# Patient Record
Sex: Female | Born: 1945
Health system: Southern US, Community
[De-identification: ages and names within clinical notes are randomized; demographics above are authoritative.]

## PROBLEM LIST (undated history)

## (undated) DIAGNOSIS — Z9889 Other specified postprocedural states: Secondary | ICD-10-CM

## (undated) DIAGNOSIS — E119 Type 2 diabetes mellitus without complications: Secondary | ICD-10-CM

## (undated) HISTORY — PX: WRIST SURGERY: SHX841

## (undated) HISTORY — PX: ANKLE SURGERY: SHX546

## (undated) HISTORY — PX: EYE SURGERY: SHX253

---

## 2006-10-15 ENCOUNTER — Encounter: Admission: RE | Admit: 2006-10-15 | Discharge: 2006-10-15 | Payer: Self-pay | Admitting: Occupational Medicine

## 2008-04-29 ENCOUNTER — Ambulatory Visit: Payer: Self-pay | Admitting: Interventional Radiology

## 2008-04-29 ENCOUNTER — Emergency Department (HOSPITAL_BASED_OUTPATIENT_CLINIC_OR_DEPARTMENT_OTHER): Admission: EM | Admit: 2008-04-29 | Discharge: 2008-04-29 | Payer: Self-pay | Admitting: Emergency Medicine

## 2008-05-03 ENCOUNTER — Ambulatory Visit (HOSPITAL_BASED_OUTPATIENT_CLINIC_OR_DEPARTMENT_OTHER): Admission: RE | Admit: 2008-05-03 | Discharge: 2008-05-04 | Payer: Self-pay | Admitting: Orthopedic Surgery

## 2010-04-30 LAB — BASIC METABOLIC PANEL
CO2: 29 mEq/L (ref 19–32)
Calcium: 9.4 mg/dL (ref 8.4–10.5)
Chloride: 100 mEq/L (ref 96–112)
GFR calc Af Amer: >60 mL/min (ref >60)
Glucose, Bld: 260 mg/dL — ABNORMAL HIGH (ref 70–99)
Potassium: 4.1 mEq/L (ref 3.5–5.1)
Sodium: 140 mEq/L (ref 135–145)

## 2010-04-30 LAB — DIFFERENTIAL
Basophils Absolute: 0.1 10*3/uL (ref 0.0–0.1)
Basophils Relative: 1 % (ref 0–1)
Eosinophils Relative: 2 % (ref 0–5)
Monocytes Absolute: 0.4 10*3/uL (ref 0.1–1.0)
Monocytes Relative: 5 % (ref 3–12)
Neutro Abs: 4.4 10*3/uL (ref 1.7–7.7)

## 2010-04-30 LAB — GLUCOSE, CAPILLARY: Glucose-Capillary: 223 mg/dL — ABNORMAL HIGH (ref 70–99)

## 2010-04-30 LAB — CBC
HCT: 41.3 % (ref 36.0–46.0)
Hemoglobin: 13.3 g/dL (ref 12.0–15.0)
MCHC: 32.3 g/dL (ref 30.0–36.0)
MCV: 80.4 fL (ref 78.0–100.0)
RBC: 5.14 MIL/uL — ABNORMAL HIGH (ref 3.87–5.11)
RDW: 14.4 % (ref 11.5–15.5)

## 2010-06-03 NOTE — Op Note (Signed)
Bethany Ortiz            ACCOUNT NO.:  [id number]   MEDICAL RECORD NO.:  [id number]          PATIENT TYPE:  AMB   LOCATION:  DSC                          FACILITY:  [facility]   PHYSICIAN:  Loreta Ave, M.D. DATE OF BIRTH:  Oct 28, 1945   DATE OF PROCEDURE:  05/03/2008  DATE OF DISCHARGE:                               OPERATIVE REPORT   PREOPERATIVE DIAGNOSIS:  Displaced trimalleolar ankle fracture, right  ankle.   POSTOPERATIVE DIAGNOSIS:  Displaced trimalleolar ankle fracture, right  ankle, with underlying osteopenia and marked comminution of the medial  malleolus fracture.   PROCEDURE:  Open reduction and internal fixation of trimalleolar  fracture with two 4.0 cannulated screws medial, one cannulated 4.0 screw  for the posterior fragment, a six-hole Synthes locking plate and screws  laterally.   SURGEON:  Loreta Ave, MD   ASSISTANT:  Bethany Ortiz. Barry Dienes, PA present throughout the entire case  necessary for timely completion of procedure.   ANESTHESIA:  General   BLOOD LOSS:  Minimal.   TOURNIQUET TIME:  1 hour.   SPECIMENS:  None.   CULTURES:  None.   COMPLICATIONS:  None.   DRESSINGS:  Soft compressive with short leg splint.   PROCEDURE IN DETAIL:  The patient was brought to the operating room,  placed on the operating table in supine position.  After adequate  anesthesia was obtained, ankle examined with fluoroscopic views.  The  medial side looked like one small but intact piece on fluoroscopy.  The  posterior fragment about 25% joint surface displaced superiorly.  Spiral  fracture of the fibula.  Syndesmosis still intact.  Tourniquet applied.  Prepped and draped in usual sterile fashion.  Exsanguinated with  elevation Esmarch.  Tourniquet inflated to 350 mmHg.  Attention turned  medially.  Longitudinal incision on the medial malleolus heading down  distal and a little posterior.  Skin and subcutaneous tissue divided.  Fracture exposed.  More  comminution appreciated on x-ray.  I was  eventually able to get this anatomically reduced and then fixed with  guide wires and two 4.0 screws.  This held the fragment in place,  restored the joint surface.  Comminution at the outer side was then  trapped with a figure-of-eight Vicryl.  It was brought through a hole in  the tibia to lock the outer fragments in place.  Although comminuted and  not grade bone quality, I was very pleased with alignment and fixation.  Attention turned laterally.  Longitudinal incision over the fibula.  Subperiosteal exposure fracture.  Fracture cleaned out.  Reduced  anatomically.  Fixed with a 6-hole plate placed posterolaterally.  Care  taken not to enter the joint screws.  Good compression alignment and  fixation.  Mortis intact but the posterior fragment was still pushed up  and back a little bit.  With blunt dissection, I went behind the tibia  from lateral side I was able to bring the fragment down and hold in  place.  From the medial side anteriorly, a guidewire was passed from the  tibia anteromedially across the posterior fragment into that back  fragment.  Holding and locked in place, it was free drilled and then  fixed with a 4.0 lag screw.  This brought the fragment with good  compression against the back of the tibia and also restored nice  congruent joint surface.  At completion, all fractures and mortise  aligned anatomically.  This was confirmed looking at the ankle  throughout.  Wounds were all irrigated.  Closed with Vicryl and then  staples.  Sterile compressive dressing applied.  Short leg splint  applied.  Tourniquet inflated and removed.  Anesthesia reversed.  Brought to the recovery room.  Tolerated surgery well.  No  complications.      Loreta Ave, M.D.  Electronically Signed     DFM/MEDQ  D:  05/03/2008  T:  05/04/2008  Job:  [id number]

## 2010-11-11 ENCOUNTER — Encounter: Payer: Self-pay | Admitting: Family Medicine

## 2010-11-11 ENCOUNTER — Inpatient Hospital Stay (INDEPENDENT_AMBULATORY_CARE_PROVIDER_SITE_OTHER)
Admission: RE | Admit: 2010-11-11 | Discharge: 2010-11-11 | Disposition: A | Discharge status: Home / Self Care | Payer: Self-pay | Source: Ambulatory Visit | Attending: Family Medicine | Admitting: Family Medicine

## 2010-11-11 ENCOUNTER — Ambulatory Visit
Admission: RE | Admit: 2010-11-11 | Discharge: 2010-11-11 | Disposition: A | Discharge status: Home / Self Care | Payer: Self-pay | Source: Ambulatory Visit | Attending: Family Medicine | Admitting: Family Medicine

## 2010-11-11 ENCOUNTER — Other Ambulatory Visit: Payer: Self-pay | Admitting: Family Medicine

## 2010-11-11 DIAGNOSIS — IMO0002 Reserved for concepts with insufficient information to code with codable children: Secondary | ICD-10-CM

## 2010-11-11 DIAGNOSIS — E119 Type 2 diabetes mellitus without complications: Secondary | ICD-10-CM | POA: Insufficient documentation

## 2010-11-11 DIAGNOSIS — M79609 Pain in unspecified limb: Secondary | ICD-10-CM

## 2010-12-22 NOTE — Progress Notes (Signed)
Summary: Pain in left leg rm 3   Vital Signs:  Patient Profile:   65 Years Old Female CC:      LT leg pain x 4 days Height:     61 inches Weight:      124 pounds O2 Sat:      96 % O2 treatment:    Room Air Temp:     98.3 degrees F oral Pulse rate:   88 / minute Resp:     16 per minute BP sitting:   143 / 84  (left arm) Cuff size:   regular  Vitals Entered By: Clemens Catholic LPN (November 11, 2010 8:49 AM)                  Updated Prior Medication List: AMARYL 2 MG TABS (GLIMEPIRIDE)   Current Allergies: No known allergies History of Present Illness Chief Complaint: LT leg pain x 4 days History of Present Illness:  Subjective:  Patient complains of 4 day history of pain in left lateral lower leg when walking (she walks her dog daily).  She has been limping, and now has occasional ache in her left lower back.  She recalls no trauma to her leg.  No bowel or bladder dysfunction.  No saddle numbness.  No chest pain or shortness of breath   REVIEW OF SYSTEMS Constitutional Symptoms      Denies fever, chills, night sweats, weight loss, weight gain, and fatigue.  Eyes       Denies change in vision, eye pain, eye discharge, glasses, contact lenses, and eye surgery. Ear/Nose/Throat/Mouth       Denies hearing loss/aids, change in hearing, ear pain, ear discharge, dizziness, frequent runny nose, frequent nose bleeds, sinus problems, sore throat, hoarseness, and tooth pain or bleeding.  Respiratory       Denies dry cough, productive cough, wheezing, shortness of breath, asthma, bronchitis, and emphysema/COPD.  Cardiovascular       Denies murmurs, chest pain, and tires easily with exhertion.    Gastrointestinal       Denies stomach pain, nausea/vomiting, diarrhea, constipation, blood in bowel movements, and indigestion. Genitourniary       Denies painful urination, kidney stones, and loss of urinary control. Neurological       Denies paralysis, seizures, and  fainting/blackouts. Musculoskeletal       Denies muscle pain, joint pain, joint stiffness, decreased range of motion, redness, swelling, muscle weakness, and gout.  Skin       Denies bruising, unusual mles/lumps or sores, and hair/skin or nail changes.  Psych       Denies mood changes, temper/anger issues, anxiety/stress, speech problems, depression, and sleep problems. Other Comments: pt c/o LLE pain x 4 days. no  injury, hurts to walk.  she has taken IBF.   Past History:  Past Medical History: Diabetes mellitus, type II  Past Surgical History: ankle surgery  Family History: gmother- diabetes  Social History: Never Smoked Alcohol use-no Drug use-no Smoking Status:  never Drug Use:  no   Objective:  Appearance:  Patient appears healthy, stated age, and in no acute distress  Eyes:  Pupils are equal, round, and reactive to light and accomodation.  Extraocular movement is intact.  Conjunctivae are not inflamed.  Neck:  Supple.  No adenopathy is present.   Lungs:  Clear to auscultation.  Breath sounds are equal.  Heart:  Regular rate and rhythm without murmurs, rubs, or gallops.  Abdomen:  Nontender without masses or hepatosplenomegaly.  Bowel sounds are present.  No CVA or flank tenderness.   Back:   No areas of tenderness.  Straight leg raising test is negative.  Sitting knee extension test is negative.  Strength and sensation in the lower extremities is normal.  Patellar and achilles reflexes are normal.  Extremities:  No edema.  Pedal pulses are full and equal.  Left leg beneath knee has distinct tenderness over mid-fibula laterally.  No swelling or deformity.  No erythema or warmth.  No calf tenderness.  Negative Homan's test. X-ray left tib/fib:  Negative Assessment New Problems: SHIN SPLINTS (ICD-844.9) LEG PAIN, LEFT (ICD-729.5) DIABETES MELLITUS, TYPE II (ICD-250.00)   Plan New Medications/Changes: TRAMADOL HCL 50 MG TABS (TRAMADOL HCL) One or two tabs by mouth hs  as needed for pain  #15 x 1, 11/11/2010, Donna Christen MD  New Orders: T-DG Tibia/Fibula*L* [[id number]] New Patient Level I [99201] Planning Comments:   Begin applying ice pack several times daily.  Continue ibuprofen.  Analgesic for bedtime. Begin stretching exercises ([facility] information and instruction patient handout given).  Wear well-fitting athletic shoes with arch support. Followup with [facility] if not improved in two weeks.   The patient and/or caregiver has been counseled thoroughly with regard to medications prescribed including dosage, schedule, interactions, rationale for use, and possible side effects and they verbalize understanding.  Diagnoses and expected course of recovery discussed and will return if not improved as expected or if the condition worsens. Patient and/or caregiver verbalized understanding.  Prescriptions: TRAMADOL HCL 50 MG TABS (TRAMADOL HCL) One or two tabs by mouth hs as needed for pain  #15 x 1   Entered and Authorized by:   Donna Christen MD   Signed by:   Donna Christen MD on 11/11/2010   Method used:   Print then Give to Patient   RxID:   [id number]   Orders Added: 1)  T-DG Tibia/Fibula*L* [[id number]] 2)  New Patient Level I [62952]

## 2013-05-03 IMAGING — CR DG TIBIA/FIBULA 2V*L*
2 series · 2 of 2 positions shown · non-contrast
Comparison: None.

CLINICAL DATA: Pain, no trauma

LEFT TIBIA AND FIBULA - 2 VIEW

[view not recorded (1 of 2)]
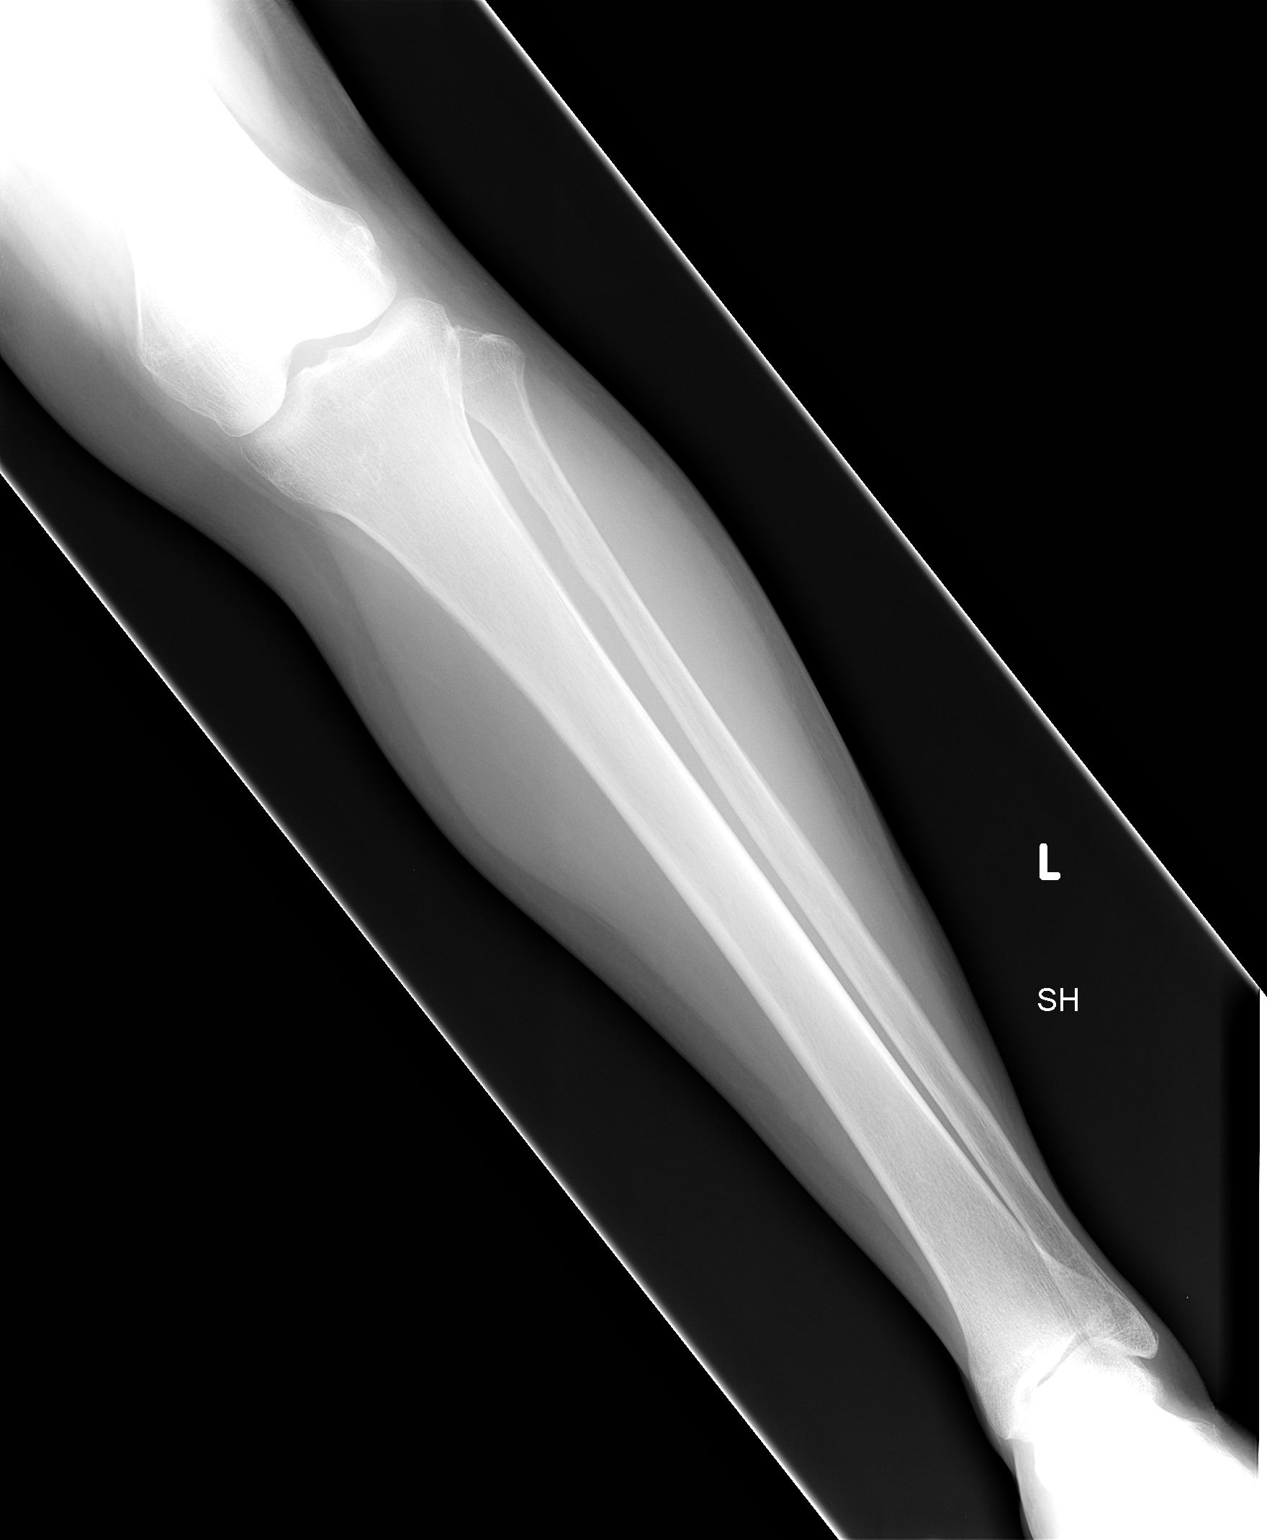

[view not recorded (2 of 2)]
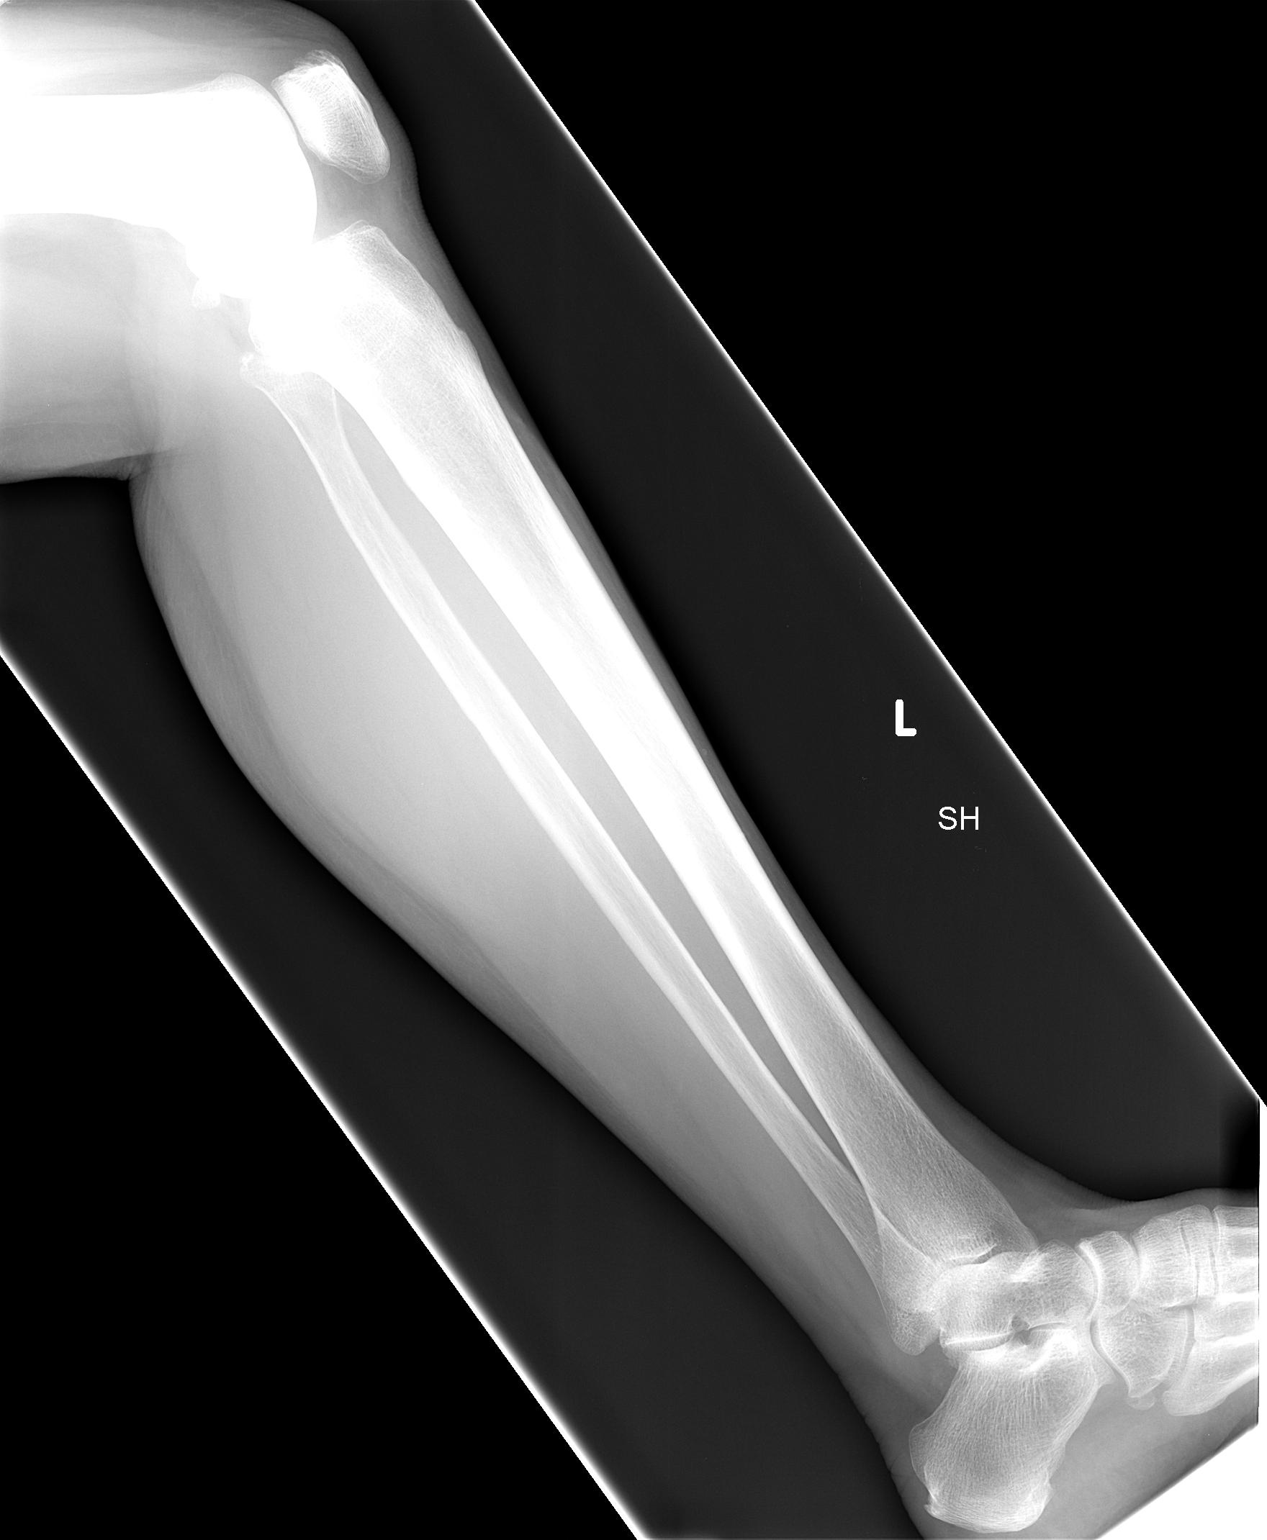

[2 of 2 positions shown; findings below may reference images not displayed]

FINDINGS: No osseous abnormality in the left tibia or fibula.  No
joint effusion.  No soft tissue abnormality.
IMPRESSION: No osseous abnormality.

## 2015-01-11 ENCOUNTER — Emergency Department
Admission: EM | Admit: 2015-01-11 | Discharge: 2015-01-11 | Disposition: A | Discharge status: Home / Self Care | Payer: Self-pay | Source: Home / Self Care | Attending: Family Medicine | Admitting: Family Medicine

## 2015-01-11 ENCOUNTER — Encounter: Payer: Self-pay | Admitting: Emergency Medicine

## 2015-01-11 DIAGNOSIS — L84 Corns and callosities: Secondary | ICD-10-CM

## 2015-01-11 HISTORY — DX: Type 2 diabetes mellitus without complications: E11.9

## 2015-01-11 HISTORY — DX: Other specified postprocedural states: Z98.890

## 2015-01-11 MED ORDER — MUPIROCIN 2 % EX OINT: 1.0000 "application " | TOPICAL_OINTMENT | Freq: Three times a day (TID) | CUTANEOUS | Status: DC

## 2015-01-11 NOTE — ED Provider Notes (Signed)
CSN: [id number]     Arrival date & time 01/11/15  1853 History   First MD Initiated Contact with Patient 01/11/15 1914     Chief Complaint  Patient presents with  . Toe Pain      HPI Comments: Patient has had a small tender lesion on her right 4th toe for about a month.  Recently the toe has become slightly red.  She began applying Neosporin ointment and the redness has almost resolved.  The history is provided by the patient.    Past Medical History  Diagnosis Date  . Diabetes mellitus without complication ([place])   . H/O eye surgery    Past Surgical History  Procedure Laterality Date  . Cesarean section      two  . Eye surgery    . Ankle surgery Right   . Wrist surgery Left    Family History  Problem Relation Age of Onset  . Cancer Father   . Diabetes Sister   . Diabetes Brother   . Hypertension Brother   . Cancer Brother   . Stroke Brother    Social History  Substance Use Topics  . Smoking status: Never Smoker   . Smokeless tobacco: None  . Alcohol Use: Yes   OB History    No data available     Review of Systems No fevers, chills, and sweats.  No pain or drainage from right 4th toe. Allergies  Review of patient's allergies indicates no known allergies.  Home Medications   Prior to Admission medications   Medication Sig Start Date End Date Taking? Authorizing Provider  glimepiride (AMARYL) 1 MG tablet Take 3 mg by mouth 2 (two) times daily.   Yes Historical Provider, MD  glipiZIDE (GLUCOTROL) 5 MG tablet Take 5 mg by mouth 2 (two) times daily before a meal.   Yes Historical Provider, MD  metFORMIN (GLUCOPHAGE) 1000 MG tablet Take 1,000 mg by mouth 2 (two) times daily with a meal.   Yes Historical Provider, MD  mupirocin ointment (BACTROBAN) 2 % Apply 1 application topically 3 (three) times daily. 01/11/15   Kandra Nicolas, MD   Meds Ordered and Administered this Visit  Medications - No data to display  BP 175/82 mmHg  Pulse 97  Temp(Src) 98.3 F (36.8  C) (Oral)  Resp 16  Ht 5\' 1"  (1.549 m)  Wt 112 lb (50.803 kg)  BMI 21.17 kg/m2  SpO2 99% No data found.   Physical Exam Patient appears comfortable and in no distress. Right foot:  No edema, swelling, erythema, or tenderness to palpation.  On the lateral edge of the 4th toe is a 49mm diameter hyperkeratotic callus which appears as if it had been previously traumatized and now healed.  Minimal surrounding erythema.  Toe has good range of motion  ED Course  Procedures none    Labs Reviewed  HEMOGLOBIN A1C     MDM   1. Skin callus; appears to have resolving cellulitis   Rx for mupirocin ointment. Change dressing daily and apply Mupirocin ointment to wound.  Use for 5 to 7 days until healed.  Separate toes with small piece of cotton.  Wear shoes that do not apply pressure to toes.    Patient has not had recent follow-up of her diabetes.  Will check Hgb A1c; recommend follow-up with local PCP for diabetic management.    Kandra Nicolas, MD 01/16/15 [phone]

## 2015-01-11 NOTE — Discharge Instructions (Signed)
Change dressing daily and apply Mupirocin ointment to wound.  Use for 5 to 7 days until healed.  Separate toes with small piece of cotton.  Wear shoes that do not apply pressure to toes.      Corns and Calluses Corns are small areas of thickened skin that occur on the top, sides, or tip of a toe. They contain a cone-shaped core with a point that can press on a nerve below. This causes pain. Calluses are areas of thickened skin that can occur anywhere on the body including hands, fingers, palms, soles of the feet, and heels.Calluses are usually larger than corns.  CAUSES  Corns and calluses are caused by rubbing (friction) or pressure, such as from shoes that are too tight or do not fit properly.  RISK FACTORS Corns are more likely to develop in people who have toe deformities, such as hammer toes. Since calluses can occur with friction to any area of the skin, calluses are more likely to develop in people who:   Work with their hands.  Wear shoes that fit poorly, shoes that are too tight, or shoes that are high-heeled.  Have toes deformities. SYMPTOMS Symptoms of a corn or callus include:  A hard growth on the skin.   Pain or tenderness under the skin.   Redness and swelling.   Increased discomfort while wearing tight-fitting shoes. DIAGNOSIS  Corns and calluses may be diagnosed with a medical history and physical exam.  TREATMENT  Corns and calluses may be treated with:  Removing the cause of the friction or pressure. This may include:  Changing your shoes.  Wearing shoe inserts (orthotics) or other protective layers in your shoes, such as a corn pad.  Wearing gloves.  Medicines to help soften skin in the hardened, thickened areas.  Reducing the size of the corn or callus by removing the dead layers of skin.  Antibiotic medicines to treat infection.  Surgery, if a toe deformity is the cause. HOME CARE INSTRUCTIONS   Take medicines only as directed by your health  care provider.  If you were prescribed an antibiotic, finish all of it even if you start to feel better.  Wear shoes that fit well. Avoid wearing high-heeled shoes and shoes that are too tight or too loose.  Wear any padding, protective layers, gloves, or orthotics as directed by your health care provider.  Soak your hands or feet and then use a file or pumice stone to soften your corn or callus. Do this as directed by your health care provider.  Check your corn or callus every day for signs of infection. Watch for:  Redness, swelling, or pain.  Fluid, blood, or pus. SEEK MEDICAL CARE IF:   Your symptoms do not improve with treatment.  You have increased redness, swelling, or pain at the site of your corn or callus.  You have fluid, blood, or pus coming from your corn or callus.  You have new symptoms.   This information is not intended to replace advice given to you by your health care provider. Make sure you discuss any questions you have with your health care provider.   Document Released: 10/12/2003 Document Revised: 05/22/2014 Document Reviewed: 01/01/2014 Elsevier Interactive Patient Education [employer].

## 2015-01-11 NOTE — ED Notes (Signed)
Reports right #4 toe has had progressive sore over past 1 month; has tried different shoes to no avail; is considered type 2 diabetic but does not check her BGs and no knowledge of HgbA1C.

## 2015-01-12 LAB — HEMOGLOBIN A1C
HEMOGLOBIN A1C: 10.6 % — AB (ref <5.7)
MEAN PLASMA GLUCOSE: 258 mg/dL — AB (ref <117)

## 2015-02-01 MED FILL — GLIMEPIRIDE 2 MG TABLET: 2 | 30 days supply | Qty: 90 | Fill #0

## 2015-03-11 MED FILL — glipiZIDE 5 MG TABS: 5 | 30 days supply | Qty: 30 | Fill #0

## 2015-04-02 MED FILL — glipiZIDE 5 MG TABS: 5 | 30 days supply | Qty: 30 | Fill #0

## 2015-04-02 MED FILL — GLIMEPIRIDE 2 MG TABLET: 2 | 30 days supply | Qty: 30 | Fill #0

## 2015-04-02 MED FILL — metFORMIN HCL 1000 MG TABS: 1000 | 30 days supply | Qty: 60 | Fill #0

## 2015-04-16 DIAGNOSIS — E119 Type 2 diabetes mellitus without complications: Secondary | ICD-10-CM | POA: Diagnosis not present

## 2015-05-02 MED FILL — GLIMEPIRIDE 2 MG TABLET: 2 | 30 days supply | Qty: 30 | Fill #0

## 2015-05-13 DIAGNOSIS — E119 Type 2 diabetes mellitus without complications: Secondary | ICD-10-CM | POA: Diagnosis not present

## 2015-05-13 DIAGNOSIS — H3582 Retinal ischemia: Secondary | ICD-10-CM | POA: Diagnosis not present

## 2015-05-13 DIAGNOSIS — E113413 Type 2 diabetes mellitus with severe nonproliferative diabetic retinopathy with macular edema, bilateral: Secondary | ICD-10-CM | POA: Diagnosis not present

## 2015-05-21 DIAGNOSIS — E119 Type 2 diabetes mellitus without complications: Secondary | ICD-10-CM | POA: Diagnosis not present

## 2015-05-27 DIAGNOSIS — E119 Type 2 diabetes mellitus without complications: Secondary | ICD-10-CM | POA: Diagnosis not present

## 2015-05-31 MED FILL — LANTUS SOLOSTAR 100 UNITS/M: 100 | 27 days supply | Qty: 6 | Fill #0

## 2015-06-24 MED FILL — LANTUS SOLOSTAR 100 UNITS/M: 100 | 27 days supply | Qty: 6 | Fill #0

## 2015-06-24 MED FILL — UNIFINE PENTIPS 31GX3/16: 31G X 5 MM | 90 days supply | Qty: 100 | Fill #0

## 2015-07-17 MED FILL — LANTUS SOLOSTAR 100 UNITS/M: 100 | 27 days supply | Qty: 6 | Fill #0

## 2015-07-31 DIAGNOSIS — E113413 Type 2 diabetes mellitus with severe nonproliferative diabetic retinopathy with macular edema, bilateral: Secondary | ICD-10-CM | POA: Diagnosis not present

## 2015-08-13 MED FILL — LANTUS SOLOSTAR 100 UNITS/M: 100 | 27 days supply | Qty: 6 | Fill #1

## 2015-08-14 MED FILL — metFORMIN HCL 500 MG TABS: 500 | 30 days supply | Qty: 120 | Fill #0

## 2015-08-16 DIAGNOSIS — E113413 Type 2 diabetes mellitus with severe nonproliferative diabetic retinopathy with macular edema, bilateral: Secondary | ICD-10-CM | POA: Diagnosis not present

## 2015-08-27 DIAGNOSIS — H2511 Age-related nuclear cataract, right eye: Secondary | ICD-10-CM | POA: Diagnosis not present

## 2015-08-27 DIAGNOSIS — H2512 Age-related nuclear cataract, left eye: Secondary | ICD-10-CM | POA: Diagnosis not present

## 2015-08-27 DIAGNOSIS — H18411 Arcus senilis, right eye: Secondary | ICD-10-CM | POA: Diagnosis not present

## 2015-08-27 DIAGNOSIS — H18412 Arcus senilis, left eye: Secondary | ICD-10-CM | POA: Diagnosis not present

## 2015-08-27 DIAGNOSIS — E113319 Type 2 diabetes mellitus with moderate nonproliferative diabetic retinopathy with macular edema, unspecified eye: Secondary | ICD-10-CM | POA: Diagnosis not present

## 2015-09-05 DIAGNOSIS — E119 Type 2 diabetes mellitus without complications: Secondary | ICD-10-CM | POA: Diagnosis not present

## 2015-09-12 DIAGNOSIS — H43811 Vitreous degeneration, right eye: Secondary | ICD-10-CM | POA: Diagnosis not present

## 2015-09-12 DIAGNOSIS — H3582 Retinal ischemia: Secondary | ICD-10-CM | POA: Diagnosis not present

## 2015-09-12 DIAGNOSIS — E113413 Type 2 diabetes mellitus with severe nonproliferative diabetic retinopathy with macular edema, bilateral: Secondary | ICD-10-CM | POA: Diagnosis not present

## 2015-09-12 MED FILL — UNIFINE PENTIPS 31GX3/16: 31G X 5 MM | 90 days supply | Qty: 100 | Fill #1

## 2015-09-12 MED FILL — LANTUS SOLOSTAR 100 UNITS/M: 100 | 68 days supply | Qty: 15 | Fill #0

## 2015-09-24 MED FILL — metFORMIN HCL 500 MG TABS: 500 | 30 days supply | Qty: 120 | Fill #0

## 2015-09-24 MED FILL — KETOROLAC 0.5% OPHTH SOLN: 0.5 | 50 days supply | Qty: 10 | Fill #0

## 2015-09-24 MED FILL — DUREZOL 0.05% EYE DROPS: 0.05 | 33 days supply | Qty: 5 | Fill #0

## 2015-09-24 MED FILL — VIGAMOX 0.5% EYE DROPS: 0.5 | 15 days supply | Qty: 3 | Fill #0

## 2015-10-07 DIAGNOSIS — H2513 Age-related nuclear cataract, bilateral: Secondary | ICD-10-CM | POA: Diagnosis not present

## 2015-10-07 DIAGNOSIS — H2512 Age-related nuclear cataract, left eye: Secondary | ICD-10-CM | POA: Diagnosis not present

## 2015-10-08 DIAGNOSIS — H2511 Age-related nuclear cataract, right eye: Secondary | ICD-10-CM | POA: Diagnosis not present

## 2015-10-08 MED FILL — MOXIFLOXACIN 0.5% EYE DROPS: 0.5 | 15 days supply | Qty: 3 | Fill #0

## 2015-10-25 MED FILL — metFORMIN HCL 500 MG TABS: 500 | 30 days supply | Qty: 120 | Fill #1

## 2015-10-30 MED FILL — DUREZOL 0.05% EYE DROPS: 0.05 | 30 days supply | Qty: 5 | Fill #0

## 2015-10-30 MED FILL — VIGAMOX 0.5% EYE DROPS: 0.5 | 15 days supply | Qty: 3 | Fill #1

## 2015-10-30 MED FILL — KETOROLAC 0.5% OPHTH SOLN: 0.5 | 50 days supply | Qty: 10 | Fill #0

## 2015-11-07 MED FILL — LANTUS SOLOSTAR 100 UNITS/M: 100 | 34 days supply | Qty: 9 | Fill #0

## 2015-12-09 MED FILL — metFORMIN HCL 500 MG TABS: 500 | 30 days supply | Qty: 120 | Fill #2

## 2015-12-26 DIAGNOSIS — E119 Type 2 diabetes mellitus without complications: Secondary | ICD-10-CM | POA: Diagnosis not present

## 2015-12-30 MED FILL — NOVOLOG FLEXPEN SYRINGE: 100 | 17 days supply | Qty: 3 | Fill #0

## 2016-01-01 MED FILL — LANTUS SOLOSTAR 100 UNITS/M: 100 | 58 days supply | Qty: 15 | Fill #0

## 2016-01-17 MED FILL — metFORMIN HCL 500 MG TABS: 500 | 30 days supply | Qty: 120 | Fill #0

## 2016-02-06 MED FILL — UNIFINE PENTIPS 31GX3/16: 31G X 5 MM | 90 days supply | Qty: 100 | Fill #0

## 2016-02-06 MED FILL — UNIFINE PENTIPS 31GX3/16": 31G X 5 MM | 90 days supply | Qty: 100 | Fill #0

## 2016-02-10 DIAGNOSIS — H524 Presbyopia: Secondary | ICD-10-CM | POA: Diagnosis not present

## 2016-02-10 DIAGNOSIS — H5213 Myopia, bilateral: Secondary | ICD-10-CM | POA: Diagnosis not present

## 2016-02-10 DIAGNOSIS — E119 Type 2 diabetes mellitus without complications: Secondary | ICD-10-CM | POA: Diagnosis not present

## 2016-02-10 DIAGNOSIS — Z794 Long term (current) use of insulin: Secondary | ICD-10-CM | POA: Diagnosis not present

## 2016-02-10 DIAGNOSIS — E113292 Type 2 diabetes mellitus with mild nonproliferative diabetic retinopathy without macular edema, left eye: Secondary | ICD-10-CM | POA: Diagnosis not present

## 2016-02-10 DIAGNOSIS — H2511 Age-related nuclear cataract, right eye: Secondary | ICD-10-CM | POA: Diagnosis not present

## 2016-02-10 DIAGNOSIS — H25041 Posterior subcapsular polar age-related cataract, right eye: Secondary | ICD-10-CM | POA: Diagnosis not present

## 2016-02-10 MED FILL — DUREZOL 0.05% EYE DROPS: 0.05 | 21 days supply | Qty: 5 | Fill #0

## 2016-02-10 MED FILL — OFLOXACIN 0.3% EYE DROPS: 0.3 | 23 days supply | Qty: 5 | Fill #0

## 2016-02-11 MED FILL — NOVOLOG FLEXPEN SYRINGE: 100 | 84 days supply | Qty: 15 | Fill #0

## 2016-02-24 MED FILL — metFORMIN HCL 500 MG TABS: 500 | 30 days supply | Qty: 120 | Fill #1

## 2016-02-26 DIAGNOSIS — H2511 Age-related nuclear cataract, right eye: Secondary | ICD-10-CM | POA: Diagnosis not present

## 2016-02-26 DIAGNOSIS — H25041 Posterior subcapsular polar age-related cataract, right eye: Secondary | ICD-10-CM | POA: Diagnosis not present

## 2016-03-10 MED FILL — UNIFINE PENTIPS 31GX3/16": 31G X 5 MM | 25 days supply | Qty: 100 | Fill #0

## 2016-03-10 MED FILL — UNIFINE PENTIPS 31GX3/16: 31G X 5 MM | 25 days supply | Qty: 100 | Fill #0

## 2016-03-20 MED FILL — metFORMIN HCL 500 MG TABS: 500 | 30 days supply | Qty: 120 | Fill #0

## 2016-03-23 MED FILL — LANTUS SOLOSTAR 100 UNITS/M: 100 | 58 days supply | Qty: 15 | Fill #0

## 2016-04-01 DIAGNOSIS — E119 Type 2 diabetes mellitus without complications: Secondary | ICD-10-CM | POA: Diagnosis not present

## 2016-04-01 DIAGNOSIS — G5601 Carpal tunnel syndrome, right upper limb: Secondary | ICD-10-CM | POA: Diagnosis not present

## 2016-04-02 DIAGNOSIS — H3581 Retinal edema: Secondary | ICD-10-CM | POA: Insufficient documentation

## 2016-04-08 DIAGNOSIS — H35373 Puckering of macula, bilateral: Secondary | ICD-10-CM | POA: Diagnosis not present

## 2016-04-08 DIAGNOSIS — E113512 Type 2 diabetes mellitus with proliferative diabetic retinopathy with macular edema, left eye: Secondary | ICD-10-CM | POA: Diagnosis not present

## 2016-04-08 DIAGNOSIS — E113411 Type 2 diabetes mellitus with severe nonproliferative diabetic retinopathy with macular edema, right eye: Secondary | ICD-10-CM | POA: Diagnosis not present

## 2016-04-08 DIAGNOSIS — H3582 Retinal ischemia: Secondary | ICD-10-CM | POA: Diagnosis not present

## 2016-04-13 MED FILL — UNIFINE PENTIPS 31GX3/16: 31G X 5 MM | 25 days supply | Qty: 100 | Fill #0

## 2016-04-13 MED FILL — UNIFINE PENTIPS 31GX3/16": 31G X 5 MM | 25 days supply | Qty: 100 | Fill #0

## 2016-04-24 MED FILL — metFORMIN HCL 500 MG TABS: 500 | 30 days supply | Qty: 120 | Fill #0

## 2016-05-07 DIAGNOSIS — E113512 Type 2 diabetes mellitus with proliferative diabetic retinopathy with macular edema, left eye: Secondary | ICD-10-CM | POA: Diagnosis not present

## 2016-05-07 DIAGNOSIS — H35372 Puckering of macula, left eye: Secondary | ICD-10-CM | POA: Diagnosis not present

## 2016-05-07 DIAGNOSIS — H4312 Vitreous hemorrhage, left eye: Secondary | ICD-10-CM | POA: Diagnosis not present

## 2016-05-08 DIAGNOSIS — E113512 Type 2 diabetes mellitus with proliferative diabetic retinopathy with macular edema, left eye: Secondary | ICD-10-CM | POA: Diagnosis not present

## 2016-05-15 DIAGNOSIS — E113411 Type 2 diabetes mellitus with severe nonproliferative diabetic retinopathy with macular edema, right eye: Secondary | ICD-10-CM | POA: Diagnosis not present

## 2016-05-15 DIAGNOSIS — E113512 Type 2 diabetes mellitus with proliferative diabetic retinopathy with macular edema, left eye: Secondary | ICD-10-CM | POA: Diagnosis not present

## 2016-05-19 MED FILL — UNIFINE PENTIPS 31GX3/16": 31G X 5 MM | 25 days supply | Qty: 100 | Fill #0

## 2016-05-19 MED FILL — metFORMIN HCL 500 MG TABS: 500 | 30 days supply | Qty: 120 | Fill #0

## 2016-05-19 MED FILL — UNIFINE PENTIPS 31GX3/16: 31G X 5 MM | 25 days supply | Qty: 100 | Fill #0

## 2016-06-10 DIAGNOSIS — E113512 Type 2 diabetes mellitus with proliferative diabetic retinopathy with macular edema, left eye: Secondary | ICD-10-CM | POA: Diagnosis not present

## 2016-06-18 MED FILL — UNIFINE PENTIPS 31GX3/16: 31G X 5 MM | 25 days supply | Qty: 100 | Fill #0

## 2016-06-18 MED FILL — UNIFINE PENTIPS 31GX3/16": 31G X 5 MM | 25 days supply | Qty: 100 | Fill #0

## 2016-06-18 MED FILL — metFORMIN HCL 500 MG TABS: 500 | 90 days supply | Qty: 360 | Fill #0

## 2016-06-18 MED FILL — NOVOLOG FLEXPEN SYRINGE: 100 | 83 days supply | Qty: 15 | Fill #0

## 2016-06-29 MED FILL — BASAGLAR 100 UNIT/ML KWIKPE: 100 | 58 days supply | Qty: 15 | Fill #0

## 2016-07-08 MED FILL — UNIFINE PENTIPS 31GX3/16": 31G X 5 MM | 25 days supply | Qty: 100 | Fill #1

## 2016-07-08 MED FILL — UNIFINE PENTIPS 31GX3/16: 31G X 5 MM | 25 days supply | Qty: 100 | Fill #1

## 2016-07-15 DIAGNOSIS — E119 Type 2 diabetes mellitus without complications: Secondary | ICD-10-CM | POA: Diagnosis not present

## 2016-07-15 DIAGNOSIS — Z0000 Encounter for general adult medical examination without abnormal findings: Secondary | ICD-10-CM | POA: Diagnosis not present

## 2016-07-20 DIAGNOSIS — E1121 Type 2 diabetes mellitus with diabetic nephropathy: Secondary | ICD-10-CM | POA: Diagnosis not present

## 2016-07-20 DIAGNOSIS — H6123 Impacted cerumen, bilateral: Secondary | ICD-10-CM | POA: Diagnosis not present

## 2016-07-20 DIAGNOSIS — Z1211 Encounter for screening for malignant neoplasm of colon: Secondary | ICD-10-CM | POA: Diagnosis not present

## 2016-07-20 DIAGNOSIS — H5702 Anisocoria: Secondary | ICD-10-CM | POA: Diagnosis not present

## 2016-07-20 DIAGNOSIS — G5601 Carpal tunnel syndrome, right upper limb: Secondary | ICD-10-CM | POA: Diagnosis not present

## 2016-07-20 DIAGNOSIS — Z23 Encounter for immunization: Secondary | ICD-10-CM | POA: Diagnosis not present

## 2016-08-28 MED FILL — UNIFINE PENTIPS 31GX3/16": 31G X 5 MM | 75 days supply | Qty: 300 | Fill #0

## 2016-08-28 MED FILL — UNIFINE PENTIPS 31GX3/16: 31G X 5 MM | 75 days supply | Qty: 300 | Fill #0

## 2016-09-09 MED FILL — BASAGLAR 100 UNIT/ML KWIKPE: 100 | 58 days supply | Qty: 15 | Fill #0

## 2016-09-14 MED FILL — metFORMIN HCL 500 MG TABS: 500 | 90 days supply | Qty: 360 | Fill #0

## 2016-10-30 DIAGNOSIS — H3582 Retinal ischemia: Secondary | ICD-10-CM | POA: Diagnosis not present

## 2016-10-30 DIAGNOSIS — H35371 Puckering of macula, right eye: Secondary | ICD-10-CM | POA: Diagnosis not present

## 2016-10-30 DIAGNOSIS — E113513 Type 2 diabetes mellitus with proliferative diabetic retinopathy with macular edema, bilateral: Secondary | ICD-10-CM | POA: Diagnosis not present

## 2016-10-30 DIAGNOSIS — H43811 Vitreous degeneration, right eye: Secondary | ICD-10-CM | POA: Diagnosis not present

## 2016-11-04 MED FILL — NOVOLOG FLEXPEN SYRINGE: 100 | 83 days supply | Qty: 15 | Fill #0

## 2016-11-17 DIAGNOSIS — E119 Type 2 diabetes mellitus without complications: Secondary | ICD-10-CM | POA: Diagnosis not present

## 2016-11-20 DIAGNOSIS — H43811 Vitreous degeneration, right eye: Secondary | ICD-10-CM | POA: Diagnosis not present

## 2016-11-20 DIAGNOSIS — E113513 Type 2 diabetes mellitus with proliferative diabetic retinopathy with macular edema, bilateral: Secondary | ICD-10-CM | POA: Diagnosis not present

## 2016-11-20 DIAGNOSIS — H3582 Retinal ischemia: Secondary | ICD-10-CM | POA: Diagnosis not present

## 2016-11-20 DIAGNOSIS — H35371 Puckering of macula, right eye: Secondary | ICD-10-CM | POA: Diagnosis not present

## 2016-11-23 DIAGNOSIS — H2513 Age-related nuclear cataract, bilateral: Secondary | ICD-10-CM | POA: Diagnosis not present

## 2016-11-30 MED FILL — BASAGLAR 100 UNIT/ML KWIKPE: 100 | 90 days supply | Qty: 30 | Fill #1

## 2016-11-30 MED FILL — UNIFINE PENTIPS 31GX3/16: 31G X 5 MM | 75 days supply | Qty: 300 | Fill #1

## 2016-11-30 MED FILL — UNIFINE PENTIPS 31GX3/16": 31G X 5 MM | 75 days supply | Qty: 300 | Fill #1

## 2016-12-04 MED FILL — metFORMIN HCL 500 MG TABS: 500 | 90 days supply | Qty: 360 | Fill #1

## 2017-02-12 DIAGNOSIS — H35371 Puckering of macula, right eye: Secondary | ICD-10-CM | POA: Diagnosis not present

## 2017-02-12 DIAGNOSIS — E113513 Type 2 diabetes mellitus with proliferative diabetic retinopathy with macular edema, bilateral: Secondary | ICD-10-CM | POA: Diagnosis not present

## 2017-02-12 DIAGNOSIS — H3582 Retinal ischemia: Secondary | ICD-10-CM | POA: Diagnosis not present

## 2017-02-12 DIAGNOSIS — H43811 Vitreous degeneration, right eye: Secondary | ICD-10-CM | POA: Diagnosis not present

## 2017-02-16 DIAGNOSIS — E119 Type 2 diabetes mellitus without complications: Secondary | ICD-10-CM | POA: Diagnosis not present

## 2017-02-18 DIAGNOSIS — E119 Type 2 diabetes mellitus without complications: Secondary | ICD-10-CM | POA: Diagnosis not present

## 2017-03-01 MED FILL — TOBRAMYCIN 0.3% EYE DROPS: 0.3 | 25 days supply | Qty: 5 | Fill #0

## 2017-03-15 MED FILL — metFORMIN HCL 500 MG TABS: 500 | 90 days supply | Qty: 360 | Fill #0

## 2017-03-17 DIAGNOSIS — E113512 Type 2 diabetes mellitus with proliferative diabetic retinopathy with macular edema, left eye: Secondary | ICD-10-CM | POA: Diagnosis not present

## 2017-03-25 MED FILL — UNIFINE PENTIPS 31GX3/16": 31G X 5 MM | 25 days supply | Qty: 100 | Fill #2

## 2017-03-25 MED FILL — UNIFINE PENTIPS 31GX3/16: 31G X 5 MM | 25 days supply | Qty: 100 | Fill #2

## 2017-04-13 DIAGNOSIS — H3582 Retinal ischemia: Secondary | ICD-10-CM | POA: Diagnosis not present

## 2017-04-13 DIAGNOSIS — H43811 Vitreous degeneration, right eye: Secondary | ICD-10-CM | POA: Diagnosis not present

## 2017-04-13 DIAGNOSIS — H35371 Puckering of macula, right eye: Secondary | ICD-10-CM | POA: Diagnosis not present

## 2017-04-13 DIAGNOSIS — E113513 Type 2 diabetes mellitus with proliferative diabetic retinopathy with macular edema, bilateral: Secondary | ICD-10-CM | POA: Diagnosis not present

## 2017-04-26 MED FILL — UNIFINE PENTIPS 31GX3/16: 31G X 5 MM | 25 days supply | Qty: 100 | Fill #3

## 2017-04-26 MED FILL — UNIFINE PENTIPS 31GX3/16": 31G X 5 MM | 25 days supply | Qty: 100 | Fill #3

## 2017-04-27 DIAGNOSIS — E113511 Type 2 diabetes mellitus with proliferative diabetic retinopathy with macular edema, right eye: Secondary | ICD-10-CM | POA: Diagnosis not present

## 2017-05-14 DIAGNOSIS — E119 Type 2 diabetes mellitus without complications: Secondary | ICD-10-CM | POA: Diagnosis not present

## 2017-05-14 DIAGNOSIS — E1121 Type 2 diabetes mellitus with diabetic nephropathy: Secondary | ICD-10-CM | POA: Diagnosis not present

## 2017-05-20 DIAGNOSIS — E119 Type 2 diabetes mellitus without complications: Secondary | ICD-10-CM | POA: Diagnosis not present

## 2017-05-20 MED FILL — NOVOLOG FLEXPEN SYRINGE: 100 | 83 days supply | Qty: 15 | Fill #0

## 2017-05-20 MED FILL — BASAGLAR 100 UNIT/ML KWIKPE: 100 | 57 days supply | Qty: 15 | Fill #0

## 2017-05-27 DIAGNOSIS — E119 Type 2 diabetes mellitus without complications: Secondary | ICD-10-CM | POA: Diagnosis not present

## 2017-06-04 DIAGNOSIS — E119 Type 2 diabetes mellitus without complications: Secondary | ICD-10-CM | POA: Diagnosis not present

## 2017-06-15 MED FILL — UNIFINE PENTIPS 31GX3/16": 31G X 5 MM | 25 days supply | Qty: 100 | Fill #4

## 2017-06-15 MED FILL — UNIFINE PENTIPS 31GX3/16: 31G X 5 MM | 25 days supply | Qty: 100 | Fill #4

## 2017-06-28 MED FILL — metFORMIN HCL 500 MG TABS: 500 | 30 days supply | Qty: 120 | Fill #0

## 2017-07-21 DIAGNOSIS — E1121 Type 2 diabetes mellitus with diabetic nephropathy: Secondary | ICD-10-CM | POA: Diagnosis not present

## 2017-07-21 DIAGNOSIS — E559 Vitamin D deficiency, unspecified: Secondary | ICD-10-CM | POA: Diagnosis not present

## 2017-07-21 DIAGNOSIS — E119 Type 2 diabetes mellitus without complications: Secondary | ICD-10-CM | POA: Diagnosis not present

## 2017-07-23 MED FILL — UNIFINE PENTIPS 31GX3/16: 31G X 5 MM | 25 days supply | Qty: 100 | Fill #5

## 2017-07-23 MED FILL — metFORMIN HCL 500 MG TABS: 500 | 30 days supply | Qty: 120 | Fill #0

## 2017-07-23 MED FILL — UNIFINE PENTIPS 31GX3/16": 31G X 5 MM | 25 days supply | Qty: 100 | Fill #5

## 2017-07-26 DIAGNOSIS — G5601 Carpal tunnel syndrome, right upper limb: Secondary | ICD-10-CM | POA: Diagnosis not present

## 2017-07-26 DIAGNOSIS — Z23 Encounter for immunization: Secondary | ICD-10-CM | POA: Diagnosis not present

## 2017-07-26 DIAGNOSIS — H5702 Anisocoria: Secondary | ICD-10-CM | POA: Diagnosis not present

## 2017-07-26 DIAGNOSIS — E559 Vitamin D deficiency, unspecified: Secondary | ICD-10-CM | POA: Diagnosis not present

## 2017-07-26 DIAGNOSIS — Z0000 Encounter for general adult medical examination without abnormal findings: Secondary | ICD-10-CM | POA: Diagnosis not present

## 2017-07-26 DIAGNOSIS — E1121 Type 2 diabetes mellitus with diabetic nephropathy: Secondary | ICD-10-CM | POA: Diagnosis not present

## 2017-07-26 DIAGNOSIS — E11319 Type 2 diabetes mellitus with unspecified diabetic retinopathy without macular edema: Secondary | ICD-10-CM | POA: Diagnosis not present

## 2017-08-22 DIAGNOSIS — Z1211 Encounter for screening for malignant neoplasm of colon: Secondary | ICD-10-CM | POA: Diagnosis not present

## 2017-08-22 DIAGNOSIS — Z1212 Encounter for screening for malignant neoplasm of rectum: Secondary | ICD-10-CM | POA: Diagnosis not present

## 2017-08-25 MED FILL — UNIFINE PENTIPS 31GX3/16: 31G X 5 MM | 75 days supply | Qty: 300 | Fill #0

## 2017-08-25 MED FILL — metFORMIN HCL 500 MG TABS: 500 | 30 days supply | Qty: 120 | Fill #1

## 2017-08-25 MED FILL — UNIFINE PENTIPS 31GX3/16": 31G X 5 MM | 75 days supply | Qty: 300 | Fill #0

## 2017-08-30 DIAGNOSIS — Z23 Encounter for immunization: Secondary | ICD-10-CM | POA: Diagnosis not present

## 2017-09-06 DIAGNOSIS — Z800 Family history of malignant neoplasm of digestive organs: Secondary | ICD-10-CM | POA: Diagnosis not present

## 2017-09-06 DIAGNOSIS — E119 Type 2 diabetes mellitus without complications: Secondary | ICD-10-CM | POA: Diagnosis not present

## 2017-09-06 DIAGNOSIS — R195 Other fecal abnormalities: Secondary | ICD-10-CM | POA: Diagnosis not present

## 2017-09-07 MED FILL — BASAGLAR 100 UNIT/ML KWIKPE: 100 | 57 days supply | Qty: 15 | Fill #0

## 2017-09-21 DIAGNOSIS — D125 Benign neoplasm of sigmoid colon: Secondary | ICD-10-CM | POA: Diagnosis not present

## 2017-09-21 DIAGNOSIS — K635 Polyp of colon: Secondary | ICD-10-CM | POA: Diagnosis not present

## 2017-09-21 DIAGNOSIS — R195 Other fecal abnormalities: Secondary | ICD-10-CM | POA: Diagnosis not present

## 2017-09-21 DIAGNOSIS — D122 Benign neoplasm of ascending colon: Secondary | ICD-10-CM | POA: Diagnosis not present

## 2017-09-27 MED FILL — metFORMIN HCL 500 MG TABS: 500 | 30 days supply | Qty: 120 | Fill #2

## 2017-10-22 DIAGNOSIS — H3582 Retinal ischemia: Secondary | ICD-10-CM | POA: Diagnosis not present

## 2017-10-22 DIAGNOSIS — E113513 Type 2 diabetes mellitus with proliferative diabetic retinopathy with macular edema, bilateral: Secondary | ICD-10-CM | POA: Diagnosis not present

## 2017-10-22 DIAGNOSIS — H35371 Puckering of macula, right eye: Secondary | ICD-10-CM | POA: Diagnosis not present

## 2017-10-28 DIAGNOSIS — E119 Type 2 diabetes mellitus without complications: Secondary | ICD-10-CM | POA: Diagnosis not present

## 2017-10-28 DIAGNOSIS — E1121 Type 2 diabetes mellitus with diabetic nephropathy: Secondary | ICD-10-CM | POA: Diagnosis not present

## 2017-10-29 MED FILL — metFORMIN HCL 500 MG TABS: 500 | 30 days supply | Qty: 120 | Fill #3

## 2017-11-01 DIAGNOSIS — E119 Type 2 diabetes mellitus without complications: Secondary | ICD-10-CM | POA: Diagnosis not present

## 2017-11-30 MED FILL — UNIFINE PENTIPS 31GX3/16": 31G X 5 MM | 75 days supply | Qty: 300 | Fill #1

## 2017-11-30 MED FILL — metFORMIN HCL 500 MG TABS: 500 | 30 days supply | Qty: 120 | Fill #4

## 2017-11-30 MED FILL — UNIFINE PENTIPS 31GX3/16: 31G X 5 MM | 75 days supply | Qty: 300 | Fill #1

## 2017-11-30 MED FILL — BASAGLAR 100 UNIT/ML KWIKPE: 100 | 57 days supply | Qty: 15 | Fill #1

## 2017-11-30 MED FILL — NOVOLOG FLEXPEN SYRINGE: 100 | 83 days supply | Qty: 15 | Fill #1

## 2018-01-03 MED FILL — metFORMIN HCL 500 MG TABS: 500 | 30 days supply | Qty: 120 | Fill #5

## 2018-02-01 DIAGNOSIS — E11319 Type 2 diabetes mellitus with unspecified diabetic retinopathy without macular edema: Secondary | ICD-10-CM | POA: Diagnosis not present

## 2018-02-01 DIAGNOSIS — E1121 Type 2 diabetes mellitus with diabetic nephropathy: Secondary | ICD-10-CM | POA: Diagnosis not present

## 2018-02-02 MED FILL — metFORMIN HCL 500 MG TABS: 500 | 90 days supply | Qty: 360 | Fill #0

## 2018-02-02 MED FILL — BASAGLAR 100 UNIT/ML KWIKPE: 100 | 83 days supply | Qty: 15 | Fill #0

## 2018-02-04 MED FILL — NOVOLOG FLEXPEN SYRINGE: 100 | 83 days supply | Qty: 15 | Fill #0

## 2019-07-14 ENCOUNTER — Telehealth: Payer: Self-pay

## 2019-07-14 NOTE — Telephone Encounter (Signed)
Daughter asking which med Dr Assunta Found prescribed when seen for shin splints in past. Pt if currently out of country in [place] and wants to tell Dr out there what she was rx'd that would help.

## 2019-12-15 ENCOUNTER — Emergency Department (INDEPENDENT_AMBULATORY_CARE_PROVIDER_SITE_OTHER)
Admission: EM | Admit: 2019-12-15 | Discharge: 2019-12-15 | Disposition: A | Discharge status: Home / Self Care | Payer: Medicare Other | Source: Home / Self Care

## 2019-12-15 ENCOUNTER — Other Ambulatory Visit: Payer: Self-pay | Admitting: Family Medicine

## 2019-12-15 ENCOUNTER — Encounter: Payer: Self-pay | Admitting: Emergency Medicine

## 2019-12-15 ENCOUNTER — Other Ambulatory Visit: Payer: Self-pay

## 2019-12-15 DIAGNOSIS — G6289 Other specified polyneuropathies: Secondary | ICD-10-CM

## 2019-12-15 DIAGNOSIS — I739 Peripheral vascular disease, unspecified: Secondary | ICD-10-CM

## 2019-12-15 MED ORDER — GABAPENTIN 300 MG PO CAPS: ORAL_CAPSULE | ORAL | 3 refills | Status: DC

## 2019-12-15 MED ORDER — KETOROLAC TROMETHAMINE 30 MG/ML IJ SOLN
30.0000 mg | Freq: Once | INTRAMUSCULAR | Status: AC
  Administered 2019-12-15: 30 mg via INTRAMUSCULAR

## 2019-12-15 MED FILL — GABAPENTIN 300 MG CAPSULE: 300 | 22 days supply | Qty: 90 | Fill #0

## 2019-12-15 NOTE — ED Provider Notes (Signed)
[facility]    CSN: [id number] Arrival date & time: 12/15/19  0807     History   Chief Complaint Chief Complaint  Patient presents with  . Leg Pain    left   HPI Bethany Ortiz is a 73 y.o. female.   HPI  Patient presents today for evaluation of left lateral leg pain which occasionally shoots upward into her thigh.  Patient has history of diabetes and has recently returned from an extended trip in [place].  She is not having any swelling in her legs however reports that over the last 3 to 4 months she has had some degree of leg pain but over the last week her symptoms have worsened.  The pain is only present with standing and walking. Left leg pain occurs intermittently.  She is able to walk around during her exam at present and not experiencing any pain but reports this morning the degree of the left lateral leg pain almost caused her to fall as she was ambulating to the bathroom. She is currently taking gabapentin for peripheral neuropathy and reports that pain has still been occurring in spite of medication.  Given patient has been abroad since 2020 she has not had any recent follow-up with her primary care provider and has been receiving treatment in [place] for chronic conditions.  To patient's knowledge she has not been diagnosed with any underlying peripheral vascular disease.  She endorses peripheral neuropathy however.  She is not currently taking any statin therapy.  She denies any chest pain  Past Medical History:  Diagnosis Date  . Diabetes mellitus without complication ([place])   . H/O eye surgery     Patient Active Problem List   Diagnosis Date Noted  . 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 DIABETES MELLITUS, TYPE II 11/11/2010    Past Surgical History:  Procedure Laterality Date  . ANKLE SURGERY Right   . CESAREAN SECTION     two  . EYE SURGERY    . WRIST SURGERY Left     OB History   No obstetric history on file.      Home Medications    Prior to  Admission medications   Medication Sig Start Date End Date Taking? Authorizing Provider  amLODipine (NORVASC) 5 MG tablet Take 5 mg by mouth daily.   Yes [provider]  calcium carbonate (OS-CAL - DOSED IN MG OF ELEMENTAL CALCIUM) 1250 (500 Ca) MG tablet Take 1 tablet by mouth daily at 2 PM.   Yes [provider]  celecoxib (CELEBREX) 400 MG capsule Take 400 mg by mouth daily after breakfast.   Yes [provider]  diclofenac (CATAFLAM) 50 MG tablet Take 50 mg by mouth 3 (three) times daily.   Yes [provider]  gabapentin (NEURONTIN) 300 MG capsule Take 300 mg by mouth 3 (three) times daily.   Yes [provider]  glimepiride (AMARYL) 1 MG tablet Take 3 mg by mouth 2 (two) times daily.   Yes [provider]  glipiZIDE (GLUCOTROL) 5 MG tablet Take 5 mg by mouth 2 (two) times daily before a meal.   Yes [provider]  insulin aspart (NOVOLOG FLEXPEN) 100 UNIT/ML FlexPen  12/30/15  Yes [provider]  insulin glargine (LANTUS SOLOSTAR) 100 UNIT/ML Solostar Pen  01/01/16  Yes [provider]  metFORMIN (GLUCOPHAGE) 1000 MG tablet Take 1,000 mg by mouth 2 (two) times daily with a meal.   Yes [provider]  mupirocin ointment (BACTROBAN) 2 %  Apply 1 application topically 3 (three) times daily. Patient not taking: Reported on 12/15/2019 01/11/15   Kandra Nicolas, MD    Family History Family History  Problem Relation Age of Onset  . Cancer Father   . Diabetes Sister   . Diabetes Brother   . Hypertension Brother   . Cancer Brother   . Stroke Brother   . Healthy Mother     Social History Social History   Tobacco Use  . Smoking status: Never Smoker  . Smokeless tobacco: Never Used  Substance Use Topics  . Alcohol use: Never  . Drug use: Never     Allergies   Patient has no known allergies.   Review of Systems Review of Systems Pertinent negatives listed in HPI  Physical  Exam Triage Vital Signs ED Triage Vitals  Enc Vitals Group     BP 12/15/19 0823 (!) 144/82     Pulse Rate 12/15/19 0823 (!) 111     Resp 12/15/19 0823 17     Temp 12/15/19 0823 97.9 F (36.6 C)     Temp Source 12/15/19 0823 Oral     SpO2 12/15/19 0823 95 %     Weight 12/15/19 0826 116 lb 13.5 oz (53 kg)     Height 12/15/19 0826 5\' 1"  (1.549 m)     Head Circumference --      Peak Flow --      Pain Score 12/15/19 0825 6     Pain Loc --      Pain Edu? --      Excl. in [place]? --    No data found.  Updated Vital Signs BP (!) 144/82 (BP Location: Left Arm)   Pulse (!) 111   Temp 97.9 F (36.6 C) (Oral)   Resp 17   Ht 5\' 1"  (1.549 m)   Wt 116 lb 13.5 oz (53 kg)   SpO2 95%   BMI 22.08 kg/m   Visual Acuity Right Eye Distance:   Left Eye Distance:   Bilateral Distance:    Right Eye Near:   Left Eye Near:    Bilateral Near:     Physical Exam Constitutional:      Appearance: Normal appearance.  HENT:     Head: Normocephalic and atraumatic.  Cardiovascular:     Rate and Rhythm: Normal rate and regular rhythm.  Pulmonary:     Effort: Pulmonary effort is normal.     Breath sounds: Normal breath sounds.  Chest:     Chest wall: No tenderness.  Musculoskeletal:       Legs:  Skin:    General: Skin is dry.     Capillary Refill: Capillary refill takes less than 2 seconds.  Neurological:     General: No focal deficit present.     Mental Status: She is alert.     Motor: No weakness.     Coordination: Coordination normal.  Psychiatric:        Mood and Affect: Mood normal.        Behavior: Behavior normal.        Thought Content: Thought content normal.        Judgment: Judgment normal.    UC Treatments / Results  Labs (all labs ordered are listed, but only abnormal results are displayed) Labs Reviewed - No data to display  EKG   Radiology No results found.  Procedures Procedures (including critical care time)  Medications Ordered in UC Medications - No  data to display  Initial Impression / Assessment and Plan / UC Course  I have reviewed the triage vital signs and the nursing notes.  Pertinent labs & imaging results that were available during my care of the patient were reviewed by me and considered in my medical decision making (see chart for details).    Exam findings are concerning for possible peripheral vascular disease causing claudication symptoms.  Placing a consult for patient to be evaluated by vascular.  Patient has risk factors such as  type 2 diabetes and  Hypertension.  Given symptoms of leg pain have been intermittently occurring over the last 3 to 4 months and have intermittently responded to gabapentin suspect there is some underlying vascular component to symptoms.  Patient has been in [place] for almost 2 years and has not been able to follow-up with her primary care here in the states however daughter who accompanied her with today's visit is scheduling her for follow-up with PCP next week.  I will go ahead and place a vascular consult through [facility] vascular center for further work-up and evaluation.  At present I will increase her gabapentin and also treated her here in clinic with a dose of Toradol 30 mg.  Also encouraged to wear of compression socks as this will improve blood flow and reduce pain. Given patient's 2-day history of airline travel advised patient and family to monitor for signs leg swelling, leg discoloration, shortness of breath or any chest pain as patient would need immediate emergent evaluation to rule out any type of a blood clot.  At present no indication that any such etiology has developed however again given underlying conditions and recent airline travel patient should continue to monitor for any acute onset of the symptoms. Final Clinical Impressions(s) / UC Diagnoses   Final diagnoses:  Claudication of left lower extremity ([place])  Other polyneuropathy     Discharge Instructions     Your  symptoms today are concerning for that of claudication which is pain related to vascular disease. I have placed a referral for your to be seen by Vascular specialist here at [facility].  In the meantime while you are waiting for that appointment I would recommend wearing compression socks or compression stockings daily to improve blood flow.  Given that you have recently flown for an extended period of time continue to monitor for any signs of calf pain swelling or shortness of breath if any of the symptoms do develop go immediately to the closest emergency department.  Today I do not see any signs or evidence of any blood clot and you are not having any symptoms concerning for blood clot in the chest.  These are all risk factors following a prolonged flight therefore continue to monitor for any abnormal development of the symptoms.  I have increased her gabapentin which will help with the leg pain to be taken as follows 300 mg in the morning, 300 mg at dinner and increasing to 600 mg at bedtime.  Continue taking other medications as prescribed and follow-up with primary care provider.    ED Prescriptions    Medication Sig Dispense Auth. Provider   gabapentin (NEURONTIN) 300 MG capsule Take 300 mg twice daily by mouth and take 600 mg at bedtime daily 90 capsule Scot Jun, FNP     PDMP not reviewed this encounter.   Scot Jun, [employer] 12/15/19 [phone]

## 2019-12-15 NOTE — Discharge Instructions (Addendum)
Your symptoms today are concerning for that of claudication which is pain related to vascular disease. I have placed a referral for your to be seen by Vascular specialist here at [facility].  In the meantime while you are waiting for that appointment I would recommend wearing compression socks or compression stockings daily to improve blood flow.  Given that you have recently flown for an extended period of time continue to monitor for any signs of calf pain swelling or shortness of breath if any of the symptoms do develop go immediately to the closest emergency department.  Today I do not see any signs or evidence of any blood clot and you are not having any symptoms concerning for blood clot in the chest.  These are all risk factors following a prolonged flight therefore continue to monitor for any abnormal development of the symptoms.  I have increased her gabapentin which will help with the leg pain to be taken as follows 300 mg in the morning, 300 mg at dinner and increasing to 600 mg at bedtime.  Continue taking other medications as prescribed and follow-up with primary care provider.

## 2019-12-15 NOTE — ED Triage Notes (Signed)
Pain to Left lower leg - pain w/ walking only  Same pain as a previous visit w/ Dr Assunta Found Pt had pain 1 week prior to flying  Pt flew in from [place] yesterday - on plane for 2 days  No swelling/redness or warmth noted  COVID vaccine  & tested prior to flying (negative)

## 2019-12-19 ENCOUNTER — Telehealth: Payer: Self-pay

## 2019-12-19 ENCOUNTER — Other Ambulatory Visit (HOSPITAL_COMMUNITY): Payer: Self-pay | Admitting: Internal Medicine

## 2019-12-19 ENCOUNTER — Other Ambulatory Visit: Payer: Self-pay

## 2019-12-19 ENCOUNTER — Ambulatory Visit (HOSPITAL_COMMUNITY)
Admission: RE | Admit: 2019-12-19 | Discharge: 2019-12-19 | Disposition: A | Discharge status: Home / Self Care | Payer: Medicare Other | Source: Ambulatory Visit | Attending: Internal Medicine | Admitting: Internal Medicine

## 2019-12-19 DIAGNOSIS — R21 Rash and other nonspecific skin eruption: Secondary | ICD-10-CM | POA: Diagnosis not present

## 2019-12-19 DIAGNOSIS — E11319 Type 2 diabetes mellitus with unspecified diabetic retinopathy without macular edema: Secondary | ICD-10-CM | POA: Diagnosis not present

## 2019-12-19 DIAGNOSIS — E7800 Pure hypercholesterolemia, unspecified: Secondary | ICD-10-CM | POA: Diagnosis not present

## 2019-12-19 DIAGNOSIS — E559 Vitamin D deficiency, unspecified: Secondary | ICD-10-CM | POA: Diagnosis not present

## 2019-12-19 DIAGNOSIS — M79605 Pain in left leg: Secondary | ICD-10-CM | POA: Diagnosis not present

## 2019-12-19 DIAGNOSIS — K219 Gastro-esophageal reflux disease without esophagitis: Secondary | ICD-10-CM | POA: Diagnosis not present

## 2019-12-19 DIAGNOSIS — E1121 Type 2 diabetes mellitus with diabetic nephropathy: Secondary | ICD-10-CM | POA: Diagnosis not present

## 2019-12-19 DIAGNOSIS — N390 Urinary tract infection, site not specified: Secondary | ICD-10-CM | POA: Diagnosis not present

## 2019-12-19 MED FILL — valACYclovir HCL 1 GM TABS: 1 | 7 days supply | Qty: 21 | Fill #0

## 2019-12-19 NOTE — Telephone Encounter (Signed)
Pts daughter called to confirm new dosage given by Lavell Anchors of gabapentin. Confirmed with daughter.

## 2019-12-22 DIAGNOSIS — M79605 Pain in left leg: Secondary | ICD-10-CM | POA: Diagnosis not present

## 2019-12-22 DIAGNOSIS — K219 Gastro-esophageal reflux disease without esophagitis: Secondary | ICD-10-CM | POA: Diagnosis not present

## 2019-12-26 ENCOUNTER — Other Ambulatory Visit (HOSPITAL_COMMUNITY): Payer: Self-pay | Admitting: Internal Medicine

## 2019-12-26 DIAGNOSIS — Z794 Long term (current) use of insulin: Secondary | ICD-10-CM | POA: Diagnosis not present

## 2019-12-26 DIAGNOSIS — E1165 Type 2 diabetes mellitus with hyperglycemia: Secondary | ICD-10-CM | POA: Diagnosis not present

## 2019-12-26 DIAGNOSIS — E1121 Type 2 diabetes mellitus with diabetic nephropathy: Secondary | ICD-10-CM | POA: Diagnosis not present

## 2019-12-26 DIAGNOSIS — I10 Essential (primary) hypertension: Secondary | ICD-10-CM | POA: Diagnosis not present

## 2019-12-26 DIAGNOSIS — E7800 Pure hypercholesterolemia, unspecified: Secondary | ICD-10-CM | POA: Diagnosis not present

## 2019-12-26 DIAGNOSIS — G629 Polyneuropathy, unspecified: Secondary | ICD-10-CM | POA: Diagnosis not present

## 2019-12-26 MED FILL — OLMESARTAN MEDOXOMIL 20 MG: 20 | 30 days supply | Qty: 30 | Fill #0

## 2019-12-27 ENCOUNTER — Other Ambulatory Visit (HOSPITAL_COMMUNITY): Payer: Self-pay | Admitting: Internal Medicine

## 2020-01-04 ENCOUNTER — Other Ambulatory Visit (HOSPITAL_COMMUNITY): Payer: Self-pay | Admitting: Internal Medicine

## 2020-01-04 DIAGNOSIS — E1121 Type 2 diabetes mellitus with diabetic nephropathy: Secondary | ICD-10-CM | POA: Diagnosis not present

## 2020-01-04 DIAGNOSIS — G629 Polyneuropathy, unspecified: Secondary | ICD-10-CM | POA: Diagnosis not present

## 2020-01-04 DIAGNOSIS — I10 Essential (primary) hypertension: Secondary | ICD-10-CM | POA: Diagnosis not present

## 2020-01-04 DIAGNOSIS — E7800 Pure hypercholesterolemia, unspecified: Secondary | ICD-10-CM | POA: Diagnosis not present

## 2020-01-04 DIAGNOSIS — E1165 Type 2 diabetes mellitus with hyperglycemia: Secondary | ICD-10-CM | POA: Diagnosis not present

## 2020-01-04 DIAGNOSIS — Z794 Long term (current) use of insulin: Secondary | ICD-10-CM | POA: Diagnosis not present

## 2020-01-04 MED FILL — GABAPENTIN 600 MG TABLET: 600 | 90 days supply | Qty: 540 | Fill #0

## 2020-01-04 MED FILL — ROSUVASTATIN CALCIUM 20 MG: 20 | 30 days supply | Qty: 30 | Fill #0

## 2020-01-07 ENCOUNTER — Other Ambulatory Visit (HOSPITAL_COMMUNITY): Payer: Self-pay | Admitting: Internal Medicine

## 2020-01-11 DIAGNOSIS — I10 Essential (primary) hypertension: Secondary | ICD-10-CM | POA: Diagnosis not present

## 2020-01-11 DIAGNOSIS — E1121 Type 2 diabetes mellitus with diabetic nephropathy: Secondary | ICD-10-CM | POA: Diagnosis not present

## 2020-01-11 DIAGNOSIS — E7800 Pure hypercholesterolemia, unspecified: Secondary | ICD-10-CM | POA: Diagnosis not present

## 2020-01-11 DIAGNOSIS — E1165 Type 2 diabetes mellitus with hyperglycemia: Secondary | ICD-10-CM | POA: Diagnosis not present

## 2020-01-11 DIAGNOSIS — Z794 Long term (current) use of insulin: Secondary | ICD-10-CM | POA: Diagnosis not present

## 2020-01-11 DIAGNOSIS — G629 Polyneuropathy, unspecified: Secondary | ICD-10-CM | POA: Diagnosis not present

## 2020-01-11 MED FILL — OMEPRAZOLE DR 20 MG CAPSULE: 20 | 30 days supply | Qty: 30 | Fill #0

## 2020-01-18 MED FILL — OLMESARTAN MEDOXOMIL 40 MG: 40 | 30 days supply | Qty: 30 | Fill #0

## 2020-02-07 ENCOUNTER — Other Ambulatory Visit: Payer: Self-pay

## 2020-02-07 DIAGNOSIS — M79606 Pain in leg, unspecified: Secondary | ICD-10-CM

## 2020-02-13 ENCOUNTER — Other Ambulatory Visit (HOSPITAL_COMMUNITY): Payer: Self-pay | Admitting: Internal Medicine

## 2020-02-13 DIAGNOSIS — G629 Polyneuropathy, unspecified: Secondary | ICD-10-CM | POA: Diagnosis not present

## 2020-02-13 DIAGNOSIS — E1165 Type 2 diabetes mellitus with hyperglycemia: Secondary | ICD-10-CM | POA: Diagnosis not present

## 2020-02-13 DIAGNOSIS — I10 Essential (primary) hypertension: Secondary | ICD-10-CM | POA: Diagnosis not present

## 2020-02-13 DIAGNOSIS — Z794 Long term (current) use of insulin: Secondary | ICD-10-CM | POA: Diagnosis not present

## 2020-02-13 DIAGNOSIS — E1121 Type 2 diabetes mellitus with diabetic nephropathy: Secondary | ICD-10-CM | POA: Diagnosis not present

## 2020-02-13 DIAGNOSIS — E7800 Pure hypercholesterolemia, unspecified: Secondary | ICD-10-CM | POA: Diagnosis not present

## 2020-02-13 MED FILL — OLMESARTAN MEDOXOMIL 40 MG: 40 | 30 days supply | Qty: 30 | Fill #0

## 2020-02-13 MED FILL — AMLODIPINE BESYLATE 5 MG TA: 5 | 30 days supply | Qty: 30 | Fill #0

## 2020-02-23 MED FILL — AMLODIPINE BESYLATE 5 MG TA: 5 | 30 days supply | Qty: 30 | Fill #0

## 2020-02-23 MED FILL — OLMESARTAN MEDOXOMIL 40 MG: 40 | 30 days supply | Qty: 30 | Fill #0

## 2020-02-27 ENCOUNTER — Other Ambulatory Visit: Payer: Self-pay

## 2020-02-27 ENCOUNTER — Ambulatory Visit (INDEPENDENT_AMBULATORY_CARE_PROVIDER_SITE_OTHER): Payer: Medicare Other | Admitting: Vascular Surgery

## 2020-02-27 ENCOUNTER — Ambulatory Visit (HOSPITAL_COMMUNITY)
Admission: RE | Admit: 2020-02-27 | Discharge: 2020-02-27 | Disposition: A | Discharge status: Home / Self Care | Payer: Medicare Other | Source: Ambulatory Visit | Attending: Vascular Surgery | Admitting: Vascular Surgery

## 2020-02-27 ENCOUNTER — Encounter: Payer: Self-pay | Admitting: Vascular Surgery

## 2020-02-27 VITALS — BP 161/87 | HR 89 | Temp 97.5°F | Resp 20 | Ht 61.0 in | Wt 125.0 lb

## 2020-02-27 DIAGNOSIS — M79606 Pain in leg, unspecified: Secondary | ICD-10-CM

## 2020-02-27 DIAGNOSIS — M79605 Pain in left leg: Secondary | ICD-10-CM | POA: Diagnosis not present

## 2020-02-27 DIAGNOSIS — G8929 Other chronic pain: Secondary | ICD-10-CM

## 2020-02-27 MED FILL — ROSUVASTATIN CALCIUM 20 MG: 20 | 30 days supply | Qty: 30 | Fill #1

## 2020-02-27 NOTE — Progress Notes (Signed)
ASSESSMENT & PLAN:  75 y.o. female with left lower extremity pain, improving with rest. 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 No evidence of hemodynamically significant peripheral arterial disease on exam or non-invasive study today. Of note, her ABI is unreliable because of DM. Her absolute toe pressures are excellent (>100), and waveforms in bilateral PTs are normal  Follow up with me PRN.  CHIEF COMPLAINT:   Left lateral leg pain  HISTORY:  HISTORY OF PRESENT ILLNESS: Bethany Ortiz is a 75 y.o. female referred to clinic for evaluation of lateral leg pain.  She reports this is been radiating behind her knee and thigh over her buttocks and into her back occasionally.  This pain improved with changing position.  Notably is best when she elevates her leg in the seated position.  She has been minimally ambulatory over the last several weeks.  Notably, she was living in [place] much of the past 2 years during the Covid pandemic.  I think many of her chronic medical conditions were neglected during her time in [place].  She has been slowly getting her A1c and blood pressure under control.  She has been resting on the couch the past several weeks and has not been ambulating much.  Over that timeframe her pain has nearly resolved.  Previously she could walk as much as she wants.  She denies symptoms consistent with ischemic rest pain.  She denies ischemic ulceration.  Past Medical History:  Diagnosis Date  . Diabetes mellitus without complication ([place])   . H/O eye surgery     Past Surgical History:  Procedure Laterality Date  . ANKLE SURGERY Right   . CESAREAN SECTION     two  . EYE SURGERY    . WRIST SURGERY Left     Family History  Problem Relation Age of Onset  . Cancer Father   . Diabetes Sister   . Diabetes Brother   . Hypertension Brother   . Cancer Brother   . Stroke Brother   . Healthy Mother     Social History   Socioeconomic History  . Marital status: Single    Spouse  name: Not on file  . Number of children: Not on file  . Years of education: Not on file  . Highest education level: Not on file  Occupational History  . Not on file  Tobacco Use  . Smoking status: Never Smoker  . Smokeless tobacco: Never Used  Vaping Use  . Vaping Use: Never used  Substance and Sexual Activity  . Alcohol use: Never  . Drug use: Never  . Sexual activity: Not on file  Other Topics Concern  . Not on file  Social History Narrative  . Not on file   Social Determinants of Health   Financial Resource Strain: Not on file  Food Insecurity: Not on file  Transportation Needs: Not on file  Physical Activity: Not on file  Stress: Not on file  Social Connections: Not on file  Intimate Partner Violence: Not on file    No Known Allergies  Current Outpatient Medications  Medication Sig Dispense Refill  . amLODipine (NORVASC) 5 MG tablet Take 5 mg by mouth daily.    . calcium carbonate (OS-CAL - DOSED IN MG OF ELEMENTAL CALCIUM) 1250 (500 Ca) MG tablet Take 1 tablet by mouth daily at 2 PM.    . celecoxib (CELEBREX) 400 MG capsule Take 400 mg by mouth daily after breakfast.    . diclofenac (CATAFLAM) 50 MG tablet Take 50 mg  by mouth 3 (three) times daily.    Marland Kitchen gabapentin (NEURONTIN) 300 MG capsule Take 300 mg twice daily by mouth and take 600 mg at bedtime daily 90 capsule 3  . glimepiride (AMARYL) 1 MG tablet Take 3 mg by mouth 2 (two) times daily.    Marland Kitchen glipiZIDE (GLUCOTROL) 5 MG tablet Take 5 mg by mouth 2 (two) times daily before a meal.    . insulin aspart (NOVOLOG FLEXPEN) 100 UNIT/ML FlexPen     . insulin glargine (LANTUS SOLOSTAR) 100 UNIT/ML Solostar Pen     . metFORMIN (GLUCOPHAGE) 1000 MG tablet Take 1,000 mg by mouth 2 (two) times daily with a meal.    . mupirocin ointment (BACTROBAN) 2 % Apply 1 application topically 3 (three) times daily. (Patient not taking: Reported on 12/15/2019) 22 g 0   No current facility-administered medications for this visit.     REVIEW OF SYSTEMS:  [X]  denotes positive finding, [ ]  denotes negative finding Cardiac  Comments:  Chest pain or chest pressure:    Shortness of breath upon exertion:    Short of breath when lying flat:    Irregular heart rhythm:        Vascular    Pain in calf, thigh, or hip brought on by ambulation: x   Pain in feet at night that wakes you up from your sleep:     Blood clot in your veins:    Leg swelling:         Pulmonary    Oxygen at home:    Productive cough:     Wheezing:         Neurologic    Sudden weakness in arms or legs:  x   Sudden numbness in arms or legs:     Sudden onset of difficulty speaking or slurred speech:    Temporary loss of vision in one eye:     Problems with dizziness:         Gastrointestinal    Blood in stool:     Vomited blood:         Genitourinary    Burning when urinating:     Blood in urine:        Psychiatric    Major depression:         Hematologic    Bleeding problems:    Problems with blood clotting too easily:        Skin    Rashes or ulcers:        Constitutional    Fever or chills:     PHYSICAL EXAM:   Vitals:   02/27/20 1238  BP: (!) 161/87  Pulse: 89  Resp: 20  Temp: (!) 97.5 F (36.4 C)  SpO2: 97%  Weight: 125 lb (56.7 kg)  Height: 5\' 1"  (1.549 m)   Constitutional: well appearing in no distress. Appears well nourished.  Neurologic: CN intact. no focal findings. no sensory loss. Psychiatric: Mood and affect symmetric and appropriate. Eyes: No icterus. No conjunctival pallor. Ears, nose, throat: mucous membranes moist. Midline trachea.  Cardiac: regular rate and rhythm.  Respiratory: unlabored. Abdominal: soft, non-tender, non-distended.  Peripheral vascular:  Warm feet  <2s capillary refill  No palpable pedal pulses Extremity: No edema. No cyanosis. No pallor.  Skin: No gangrene. No ulceration.  Lymphatic: No Stemmer's sign. No palpable lymphadenopathy.   DATA REVIEW:    Most recent CBC CBC  Latest Ref Rng & Units 05/03/2008 04/29/2008  WBC 4.0 - 10.5 K/uL -  9.1  Hemoglobin 12.0 - 15.0 g/dL 12.5 13.3  Hematocrit 36.0 - 46.0 % - 41.3  Platelets 150 - 400 K/uL - 293     Most recent CMP CMP Latest Ref Rng & Units 04/29/2008  Glucose 70 - 99 mg/dL 260(H)  BUN 6 - 23 mg/dL 21  Creatinine 0.4 - 1.2 mg/dL 0.6  Sodium 135 - 145 mEq/L 140  Potassium 3.5 - 5.1 mEq/L 4.1  Chloride 96 - 112 mEq/L 100  CO2 19 - 32 mEq/L 29  Calcium 8.4 - 10.5 mg/dL 9.4    Renal function CrCl cannot be calculated (Patient's most recent lab result is older than the maximum 21 days allowed.).  Hgb A1c MFr Bld (%)  Date Value  01/10/2015 10.6 (H)    No results found for: LDLCALC, LDLC, HIRISKLDL, POCLDL, LDLDIRECT, REALLDLC, TOTLDLC   Vascular Imaging:   Bethany Ortiz. Stanford Breed, MD [facility] Phone Number: [phone] 02/27/2020 12:45 PM

## 2020-03-12 ENCOUNTER — Other Ambulatory Visit (HOSPITAL_COMMUNITY): Payer: Self-pay | Admitting: Internal Medicine

## 2020-03-13 MED FILL — AMLODIPINE BESYLATE 10 MG T: 10 | 90 days supply | Qty: 90 | Fill #0

## 2020-03-26 DIAGNOSIS — E7800 Pure hypercholesterolemia, unspecified: Secondary | ICD-10-CM | POA: Diagnosis not present

## 2020-03-26 DIAGNOSIS — E1121 Type 2 diabetes mellitus with diabetic nephropathy: Secondary | ICD-10-CM | POA: Diagnosis not present

## 2020-03-26 DIAGNOSIS — I10 Essential (primary) hypertension: Secondary | ICD-10-CM | POA: Diagnosis not present

## 2020-03-28 ENCOUNTER — Other Ambulatory Visit (HOSPITAL_COMMUNITY): Payer: Self-pay | Admitting: Internal Medicine

## 2020-03-28 DIAGNOSIS — E7800 Pure hypercholesterolemia, unspecified: Secondary | ICD-10-CM | POA: Diagnosis not present

## 2020-03-28 DIAGNOSIS — E1165 Type 2 diabetes mellitus with hyperglycemia: Secondary | ICD-10-CM | POA: Diagnosis not present

## 2020-03-28 DIAGNOSIS — E1121 Type 2 diabetes mellitus with diabetic nephropathy: Secondary | ICD-10-CM | POA: Diagnosis not present

## 2020-03-28 DIAGNOSIS — Z794 Long term (current) use of insulin: Secondary | ICD-10-CM | POA: Diagnosis not present

## 2020-03-28 DIAGNOSIS — I10 Essential (primary) hypertension: Secondary | ICD-10-CM | POA: Diagnosis not present

## 2020-03-28 MED FILL — FARXIGA 10 MG TABLET: 10 | 30 days supply | Qty: 30 | Fill #0

## 2020-03-29 MED FILL — ROSUVASTATIN CALCIUM 20 MG: 20 | 30 days supply | Qty: 30 | Fill #2

## 2020-03-29 MED FILL — BASAGLAR 100 UNIT/ML KWIKPE: 100 | 90 days supply | Qty: 27 | Fill #0

## 2020-05-05 MED FILL — Rosuvastatin Calcium Tab 20 MG: ORAL | 30 days supply | Qty: 30 | Fill #0 | Status: AC

## 2020-05-05 MED FILL — Dapagliflozin Propanediol Tab 10 MG (Base Equivalent): ORAL | 30 days supply | Qty: 30 | Fill #0 | Status: AC

## 2020-05-06 ENCOUNTER — Other Ambulatory Visit (HOSPITAL_COMMUNITY): Payer: Self-pay

## 2020-05-09 DIAGNOSIS — I10 Essential (primary) hypertension: Secondary | ICD-10-CM | POA: Diagnosis not present

## 2020-05-09 DIAGNOSIS — E1121 Type 2 diabetes mellitus with diabetic nephropathy: Secondary | ICD-10-CM | POA: Diagnosis not present

## 2020-05-09 DIAGNOSIS — E1165 Type 2 diabetes mellitus with hyperglycemia: Secondary | ICD-10-CM | POA: Diagnosis not present

## 2020-05-09 DIAGNOSIS — G629 Polyneuropathy, unspecified: Secondary | ICD-10-CM | POA: Diagnosis not present

## 2020-05-09 DIAGNOSIS — E7800 Pure hypercholesterolemia, unspecified: Secondary | ICD-10-CM | POA: Diagnosis not present

## 2020-05-09 DIAGNOSIS — Z794 Long term (current) use of insulin: Secondary | ICD-10-CM | POA: Diagnosis not present

## 2020-05-21 ENCOUNTER — Other Ambulatory Visit (HOSPITAL_COMMUNITY): Payer: Self-pay

## 2020-05-21 MED ORDER — METFORMIN HCL 500 MG PO TABS
1000.0000 mg | ORAL_TABLET | Freq: Two times a day (BID) | ORAL | 3 refills | Status: DC
  Filled 2020-05-21: qty 360, 90d supply, fill #0
  Filled 2020-08-16: qty 360, 90d supply, fill #1
  Filled 2020-11-27: qty 360, 90d supply, fill #2
  Filled 2021-02-21: qty 360, 90d supply, fill #3

## 2020-05-28 DIAGNOSIS — E559 Vitamin D deficiency, unspecified: Secondary | ICD-10-CM | POA: Diagnosis not present

## 2020-05-28 DIAGNOSIS — E119 Type 2 diabetes mellitus without complications: Secondary | ICD-10-CM | POA: Diagnosis not present

## 2020-05-28 DIAGNOSIS — E7800 Pure hypercholesterolemia, unspecified: Secondary | ICD-10-CM | POA: Diagnosis not present

## 2020-05-31 DIAGNOSIS — I10 Essential (primary) hypertension: Secondary | ICD-10-CM | POA: Diagnosis not present

## 2020-05-31 DIAGNOSIS — E7800 Pure hypercholesterolemia, unspecified: Secondary | ICD-10-CM | POA: Diagnosis not present

## 2020-05-31 DIAGNOSIS — H6121 Impacted cerumen, right ear: Secondary | ICD-10-CM | POA: Diagnosis not present

## 2020-05-31 DIAGNOSIS — Z0000 Encounter for general adult medical examination without abnormal findings: Secondary | ICD-10-CM | POA: Diagnosis not present

## 2020-05-31 DIAGNOSIS — E119 Type 2 diabetes mellitus without complications: Secondary | ICD-10-CM | POA: Diagnosis not present

## 2020-05-31 DIAGNOSIS — K219 Gastro-esophageal reflux disease without esophagitis: Secondary | ICD-10-CM | POA: Diagnosis not present

## 2020-06-10 ENCOUNTER — Other Ambulatory Visit (HOSPITAL_COMMUNITY): Payer: Self-pay

## 2020-06-10 ENCOUNTER — Other Ambulatory Visit (HOSPITAL_COMMUNITY): Payer: Self-pay | Admitting: Internal Medicine

## 2020-06-10 MED FILL — Dapagliflozin Propanediol Tab 10 MG (Base Equivalent): ORAL | 30 days supply | Qty: 30 | Fill #1 | Status: AC

## 2020-06-12 ENCOUNTER — Other Ambulatory Visit (HOSPITAL_COMMUNITY): Payer: Self-pay

## 2020-06-12 ENCOUNTER — Other Ambulatory Visit (HOSPITAL_COMMUNITY): Payer: Self-pay | Admitting: Internal Medicine

## 2020-06-12 MED FILL — Amlodipine Besylate Tab 10 MG (Base Equivalent): ORAL | 90 days supply | Qty: 90 | Fill #0 | Status: AC

## 2020-06-14 ENCOUNTER — Other Ambulatory Visit (HOSPITAL_COMMUNITY): Payer: Self-pay

## 2020-06-18 ENCOUNTER — Other Ambulatory Visit (HOSPITAL_COMMUNITY): Payer: Self-pay

## 2020-06-18 DIAGNOSIS — E1165 Type 2 diabetes mellitus with hyperglycemia: Secondary | ICD-10-CM | POA: Diagnosis not present

## 2020-06-18 DIAGNOSIS — Z794 Long term (current) use of insulin: Secondary | ICD-10-CM | POA: Diagnosis not present

## 2020-06-18 DIAGNOSIS — H35371 Puckering of macula, right eye: Secondary | ICD-10-CM | POA: Diagnosis not present

## 2020-06-18 DIAGNOSIS — E7800 Pure hypercholesterolemia, unspecified: Secondary | ICD-10-CM | POA: Diagnosis not present

## 2020-06-18 DIAGNOSIS — H3582 Retinal ischemia: Secondary | ICD-10-CM | POA: Diagnosis not present

## 2020-06-18 DIAGNOSIS — Z0000 Encounter for general adult medical examination without abnormal findings: Secondary | ICD-10-CM | POA: Diagnosis not present

## 2020-06-18 DIAGNOSIS — E113513 Type 2 diabetes mellitus with proliferative diabetic retinopathy with macular edema, bilateral: Secondary | ICD-10-CM | POA: Diagnosis not present

## 2020-06-20 ENCOUNTER — Other Ambulatory Visit (HOSPITAL_COMMUNITY): Payer: Self-pay

## 2020-06-20 DIAGNOSIS — Z794 Long term (current) use of insulin: Secondary | ICD-10-CM | POA: Diagnosis not present

## 2020-06-20 DIAGNOSIS — I10 Essential (primary) hypertension: Secondary | ICD-10-CM | POA: Diagnosis not present

## 2020-06-20 DIAGNOSIS — E7800 Pure hypercholesterolemia, unspecified: Secondary | ICD-10-CM | POA: Diagnosis not present

## 2020-06-20 DIAGNOSIS — E1121 Type 2 diabetes mellitus with diabetic nephropathy: Secondary | ICD-10-CM | POA: Diagnosis not present

## 2020-06-20 MED ORDER — ROSUVASTATIN CALCIUM 20 MG PO TABS
20.0000 mg | ORAL_TABLET | Freq: Every day | ORAL | 3 refills | Status: DC
Start: 2020-06-20 — End: 2021-08-11
  Filled 2020-06-20: qty 90, 90d supply, fill #0
  Filled 2020-10-27: qty 90, 90d supply, fill #1
  Filled 2021-02-13: qty 90, 90d supply, fill #2
  Filled 2021-05-15: qty 90, 90d supply, fill #3

## 2020-06-26 ENCOUNTER — Other Ambulatory Visit (HOSPITAL_COMMUNITY): Payer: Self-pay

## 2020-06-26 MED ORDER — TRULICITY 0.75 MG/0.5ML ~~LOC~~ SOAJ
0.7500 mg | SUBCUTANEOUS | 2 refills | Status: DC
  Filled 2020-06-26: qty 2, 28d supply, fill #0

## 2020-07-02 DIAGNOSIS — E113511 Type 2 diabetes mellitus with proliferative diabetic retinopathy with macular edema, right eye: Secondary | ICD-10-CM | POA: Diagnosis not present

## 2020-07-04 ENCOUNTER — Other Ambulatory Visit (HOSPITAL_COMMUNITY): Payer: Self-pay

## 2020-07-11 DIAGNOSIS — I10 Essential (primary) hypertension: Secondary | ICD-10-CM | POA: Diagnosis not present

## 2020-07-11 DIAGNOSIS — E7800 Pure hypercholesterolemia, unspecified: Secondary | ICD-10-CM | POA: Diagnosis not present

## 2020-07-11 DIAGNOSIS — Z794 Long term (current) use of insulin: Secondary | ICD-10-CM | POA: Diagnosis not present

## 2020-07-11 DIAGNOSIS — E1121 Type 2 diabetes mellitus with diabetic nephropathy: Secondary | ICD-10-CM | POA: Diagnosis not present

## 2020-07-15 ENCOUNTER — Other Ambulatory Visit (HOSPITAL_COMMUNITY): Payer: Self-pay

## 2020-07-15 MED ORDER — FARXIGA 10 MG PO TABS
10.0000 mg | ORAL_TABLET | Freq: Every day | ORAL | 1 refills | Status: DC
  Filled 2020-07-15 (×2): qty 90, 90d supply, fill #0

## 2020-07-15 MED ORDER — FARXIGA 10 MG PO TABS
10.0000 mg | ORAL_TABLET | Freq: Every day | ORAL | 1 refills | Status: DC
  Filled 2020-07-15 – 2020-10-09 (×2): qty 90, 90d supply, fill #0
  Filled 2021-01-06: qty 90, 90d supply, fill #1

## 2020-07-22 MED FILL — Insulin Glargine Soln Pen-Injector 100 Unit/ML: SUBCUTANEOUS | 90 days supply | Qty: 27 | Fill #0 | Status: AC

## 2020-07-23 ENCOUNTER — Other Ambulatory Visit (HOSPITAL_COMMUNITY): Payer: Self-pay

## 2020-07-29 ENCOUNTER — Other Ambulatory Visit (HOSPITAL_COMMUNITY): Payer: Self-pay

## 2020-07-29 MED ORDER — TRULICITY 0.75 MG/0.5ML ~~LOC~~ SOAJ
0.7500 mg | SUBCUTANEOUS | 3 refills | Status: DC
  Filled 2020-07-29: qty 2, 28d supply, fill #0
  Filled 2020-09-02: qty 2, 28d supply, fill #1
  Filled 2020-09-30: qty 2, 28d supply, fill #2

## 2020-08-02 ENCOUNTER — Other Ambulatory Visit (HOSPITAL_COMMUNITY): Payer: Self-pay

## 2020-08-13 DIAGNOSIS — E113513 Type 2 diabetes mellitus with proliferative diabetic retinopathy with macular edema, bilateral: Secondary | ICD-10-CM | POA: Diagnosis not present

## 2020-08-13 DIAGNOSIS — H35371 Puckering of macula, right eye: Secondary | ICD-10-CM | POA: Diagnosis not present

## 2020-08-13 DIAGNOSIS — H3582 Retinal ischemia: Secondary | ICD-10-CM | POA: Diagnosis not present

## 2020-08-16 ENCOUNTER — Other Ambulatory Visit (HOSPITAL_COMMUNITY): Payer: Self-pay

## 2020-09-02 ENCOUNTER — Other Ambulatory Visit (HOSPITAL_COMMUNITY): Payer: Self-pay

## 2020-09-09 ENCOUNTER — Other Ambulatory Visit (HOSPITAL_COMMUNITY): Payer: Self-pay

## 2020-09-30 ENCOUNTER — Other Ambulatory Visit (HOSPITAL_COMMUNITY): Payer: Self-pay

## 2020-10-09 ENCOUNTER — Other Ambulatory Visit (HOSPITAL_COMMUNITY): Payer: Self-pay

## 2020-10-15 ENCOUNTER — Other Ambulatory Visit (HOSPITAL_COMMUNITY): Payer: Self-pay

## 2020-10-15 DIAGNOSIS — Z794 Long term (current) use of insulin: Secondary | ICD-10-CM | POA: Diagnosis not present

## 2020-10-15 DIAGNOSIS — E1121 Type 2 diabetes mellitus with diabetic nephropathy: Secondary | ICD-10-CM | POA: Diagnosis not present

## 2020-10-15 DIAGNOSIS — I10 Essential (primary) hypertension: Secondary | ICD-10-CM | POA: Diagnosis not present

## 2020-10-15 DIAGNOSIS — E7800 Pure hypercholesterolemia, unspecified: Secondary | ICD-10-CM | POA: Diagnosis not present

## 2020-10-21 ENCOUNTER — Other Ambulatory Visit (HOSPITAL_BASED_OUTPATIENT_CLINIC_OR_DEPARTMENT_OTHER): Payer: Self-pay

## 2020-10-22 ENCOUNTER — Other Ambulatory Visit (HOSPITAL_COMMUNITY): Payer: Self-pay

## 2020-10-27 MED FILL — Amlodipine Besylate Tab 10 MG (Base Equivalent): ORAL | 90 days supply | Qty: 90 | Fill #1 | Status: AC

## 2020-10-28 ENCOUNTER — Other Ambulatory Visit (HOSPITAL_COMMUNITY): Payer: Self-pay

## 2020-10-29 ENCOUNTER — Other Ambulatory Visit (HOSPITAL_BASED_OUTPATIENT_CLINIC_OR_DEPARTMENT_OTHER): Payer: Self-pay

## 2020-11-04 ENCOUNTER — Other Ambulatory Visit (HOSPITAL_COMMUNITY): Payer: Self-pay

## 2020-11-19 ENCOUNTER — Other Ambulatory Visit (HOSPITAL_COMMUNITY): Payer: Self-pay

## 2020-11-19 DIAGNOSIS — Z794 Long term (current) use of insulin: Secondary | ICD-10-CM | POA: Diagnosis not present

## 2020-11-19 DIAGNOSIS — E1121 Type 2 diabetes mellitus with diabetic nephropathy: Secondary | ICD-10-CM | POA: Diagnosis not present

## 2020-11-19 DIAGNOSIS — E7800 Pure hypercholesterolemia, unspecified: Secondary | ICD-10-CM | POA: Diagnosis not present

## 2020-11-19 DIAGNOSIS — I10 Essential (primary) hypertension: Secondary | ICD-10-CM | POA: Diagnosis not present

## 2020-11-19 MED ORDER — TRADJENTA 5 MG PO TABS
5.0000 mg | ORAL_TABLET | Freq: Every day | ORAL | 1 refills | Status: DC
  Filled 2020-11-19: qty 90, 90d supply, fill #0
  Filled 2021-02-13: qty 90, 90d supply, fill #1

## 2020-11-27 ENCOUNTER — Other Ambulatory Visit (HOSPITAL_COMMUNITY): Payer: Self-pay

## 2020-12-16 ENCOUNTER — Other Ambulatory Visit (HOSPITAL_COMMUNITY): Payer: Self-pay

## 2020-12-16 MED FILL — Insulin Glargine Soln Pen-Injector 100 Unit/ML: SUBCUTANEOUS | 90 days supply | Qty: 27 | Fill #1 | Status: AC

## 2020-12-17 DIAGNOSIS — E113513 Type 2 diabetes mellitus with proliferative diabetic retinopathy with macular edema, bilateral: Secondary | ICD-10-CM | POA: Diagnosis not present

## 2020-12-17 DIAGNOSIS — H3582 Retinal ischemia: Secondary | ICD-10-CM | POA: Diagnosis not present

## 2020-12-17 DIAGNOSIS — H35371 Puckering of macula, right eye: Secondary | ICD-10-CM | POA: Diagnosis not present

## 2021-01-06 ENCOUNTER — Other Ambulatory Visit (HOSPITAL_COMMUNITY): Payer: Self-pay

## 2021-01-28 DIAGNOSIS — E1121 Type 2 diabetes mellitus with diabetic nephropathy: Secondary | ICD-10-CM | POA: Diagnosis not present

## 2021-01-28 DIAGNOSIS — E7800 Pure hypercholesterolemia, unspecified: Secondary | ICD-10-CM | POA: Diagnosis not present

## 2021-01-28 DIAGNOSIS — I10 Essential (primary) hypertension: Secondary | ICD-10-CM | POA: Diagnosis not present

## 2021-02-11 ENCOUNTER — Other Ambulatory Visit (HOSPITAL_COMMUNITY): Payer: Self-pay

## 2021-02-11 DIAGNOSIS — H3582 Retinal ischemia: Secondary | ICD-10-CM | POA: Diagnosis not present

## 2021-02-11 DIAGNOSIS — E113513 Type 2 diabetes mellitus with proliferative diabetic retinopathy with macular edema, bilateral: Secondary | ICD-10-CM | POA: Diagnosis not present

## 2021-02-11 DIAGNOSIS — H26491 Other secondary cataract, right eye: Secondary | ICD-10-CM | POA: Diagnosis not present

## 2021-02-11 DIAGNOSIS — H35371 Puckering of macula, right eye: Secondary | ICD-10-CM | POA: Diagnosis not present

## 2021-02-11 MED ORDER — PREDNISOLONE ACETATE 1 % OP SUSP
1.0000 [drp] | Freq: Four times a day (QID) | OPHTHALMIC | 5 refills | Status: DC
  Filled 2021-02-11: qty 10, 50d supply, fill #0

## 2021-02-11 MED ORDER — OFLOXACIN 0.3 % OP SOLN
1.0000 [drp] | Freq: Four times a day (QID) | OPHTHALMIC | 5 refills | Status: DC
  Filled 2021-02-11: qty 5, 25d supply, fill #0
  Filled 2021-02-27: qty 5, 25d supply, fill #1

## 2021-02-13 ENCOUNTER — Other Ambulatory Visit (HOSPITAL_COMMUNITY): Payer: Self-pay

## 2021-02-13 MED FILL — Amlodipine Besylate Tab 10 MG (Base Equivalent): ORAL | 90 days supply | Qty: 90 | Fill #2 | Status: AC

## 2021-02-20 DIAGNOSIS — Z794 Long term (current) use of insulin: Secondary | ICD-10-CM | POA: Diagnosis not present

## 2021-02-20 DIAGNOSIS — E1121 Type 2 diabetes mellitus with diabetic nephropathy: Secondary | ICD-10-CM | POA: Diagnosis not present

## 2021-02-20 DIAGNOSIS — E7800 Pure hypercholesterolemia, unspecified: Secondary | ICD-10-CM | POA: Diagnosis not present

## 2021-02-20 DIAGNOSIS — I10 Essential (primary) hypertension: Secondary | ICD-10-CM | POA: Diagnosis not present

## 2021-02-21 ENCOUNTER — Other Ambulatory Visit (HOSPITAL_COMMUNITY): Payer: Self-pay

## 2021-02-24 DIAGNOSIS — H35371 Puckering of macula, right eye: Secondary | ICD-10-CM | POA: Diagnosis not present

## 2021-02-24 DIAGNOSIS — H26491 Other secondary cataract, right eye: Secondary | ICD-10-CM | POA: Diagnosis not present

## 2021-02-24 DIAGNOSIS — H4311 Vitreous hemorrhage, right eye: Secondary | ICD-10-CM | POA: Diagnosis not present

## 2021-02-24 DIAGNOSIS — E113511 Type 2 diabetes mellitus with proliferative diabetic retinopathy with macular edema, right eye: Secondary | ICD-10-CM | POA: Diagnosis not present

## 2021-02-25 DIAGNOSIS — H35371 Puckering of macula, right eye: Secondary | ICD-10-CM | POA: Diagnosis not present

## 2021-02-25 DIAGNOSIS — H3581 Retinal edema: Secondary | ICD-10-CM | POA: Diagnosis not present

## 2021-02-25 DIAGNOSIS — E113511 Type 2 diabetes mellitus with proliferative diabetic retinopathy with macular edema, right eye: Secondary | ICD-10-CM | POA: Diagnosis not present

## 2021-02-28 ENCOUNTER — Other Ambulatory Visit (HOSPITAL_COMMUNITY): Payer: Self-pay

## 2021-03-04 DIAGNOSIS — H35371 Puckering of macula, right eye: Secondary | ICD-10-CM | POA: Diagnosis not present

## 2021-03-04 DIAGNOSIS — E113511 Type 2 diabetes mellitus with proliferative diabetic retinopathy with macular edema, right eye: Secondary | ICD-10-CM | POA: Diagnosis not present

## 2021-03-25 DIAGNOSIS — E113513 Type 2 diabetes mellitus with proliferative diabetic retinopathy with macular edema, bilateral: Secondary | ICD-10-CM | POA: Diagnosis not present

## 2021-04-07 ENCOUNTER — Other Ambulatory Visit (HOSPITAL_COMMUNITY): Payer: Self-pay

## 2021-04-07 MED ORDER — FARXIGA 10 MG PO TABS
10.0000 mg | ORAL_TABLET | Freq: Every day | ORAL | 1 refills | Status: DC
  Filled 2021-04-07: qty 90, 90d supply, fill #0
  Filled 2021-07-08: qty 90, 90d supply, fill #1

## 2021-05-06 ENCOUNTER — Other Ambulatory Visit (HOSPITAL_COMMUNITY): Payer: Self-pay

## 2021-05-07 ENCOUNTER — Other Ambulatory Visit (HOSPITAL_COMMUNITY): Payer: Self-pay

## 2021-05-07 MED ORDER — BASAGLAR KWIKPEN 100 UNIT/ML ~~LOC~~ SOPN
20.0000 [IU] | PEN_INJECTOR | Freq: Every day | SUBCUTANEOUS | 5 refills | Status: AC
  Filled 2021-05-07: qty 18, 90d supply, fill #0
  Filled 2021-08-04: qty 18, 90d supply, fill #1
  Filled 2021-11-18 – 2021-11-26 (×2): qty 18, 90d supply, fill #2
  Filled 2022-03-09: qty 18, 90d supply, fill #3

## 2021-05-15 ENCOUNTER — Other Ambulatory Visit (HOSPITAL_COMMUNITY): Payer: Self-pay

## 2021-05-15 MED ORDER — TRADJENTA 5 MG PO TABS
5.0000 mg | ORAL_TABLET | Freq: Every day | ORAL | 1 refills | Status: AC
  Filled 2021-05-15: qty 90, 90d supply, fill #0
  Filled 2021-08-11: qty 90, 90d supply, fill #1

## 2021-05-15 MED ORDER — METFORMIN HCL 500 MG PO TABS
1000.0000 mg | ORAL_TABLET | Freq: Two times a day (BID) | ORAL | 3 refills | Status: DC
  Filled 2021-05-15: qty 360, 90d supply, fill #0
  Filled 2021-08-11: qty 360, 90d supply, fill #1
  Filled 2021-11-06: qty 360, 90d supply, fill #2
  Filled 2022-02-15: qty 360, 90d supply, fill #3

## 2021-05-16 ENCOUNTER — Other Ambulatory Visit (HOSPITAL_COMMUNITY): Payer: Self-pay

## 2021-05-19 ENCOUNTER — Other Ambulatory Visit (HOSPITAL_COMMUNITY): Payer: Self-pay

## 2021-05-22 ENCOUNTER — Other Ambulatory Visit (HOSPITAL_COMMUNITY): Payer: Self-pay

## 2021-05-22 MED ORDER — AMLODIPINE BESYLATE 10 MG PO TABS
10.0000 mg | ORAL_TABLET | Freq: Every day | ORAL | 2 refills | Status: DC
  Filled 2021-05-22: qty 90, 90d supply, fill #0
  Filled 2021-08-11: qty 90, 90d supply, fill #1

## 2021-05-23 ENCOUNTER — Other Ambulatory Visit (HOSPITAL_COMMUNITY): Payer: Self-pay

## 2021-06-03 DIAGNOSIS — E7800 Pure hypercholesterolemia, unspecified: Secondary | ICD-10-CM | POA: Diagnosis not present

## 2021-06-03 DIAGNOSIS — E559 Vitamin D deficiency, unspecified: Secondary | ICD-10-CM | POA: Diagnosis not present

## 2021-06-03 DIAGNOSIS — E1121 Type 2 diabetes mellitus with diabetic nephropathy: Secondary | ICD-10-CM | POA: Diagnosis not present

## 2021-06-10 DIAGNOSIS — Z780 Asymptomatic menopausal state: Secondary | ICD-10-CM | POA: Diagnosis not present

## 2021-06-10 DIAGNOSIS — E1165 Type 2 diabetes mellitus with hyperglycemia: Secondary | ICD-10-CM | POA: Diagnosis not present

## 2021-06-10 DIAGNOSIS — E559 Vitamin D deficiency, unspecified: Secondary | ICD-10-CM | POA: Diagnosis not present

## 2021-06-10 DIAGNOSIS — I10 Essential (primary) hypertension: Secondary | ICD-10-CM | POA: Diagnosis not present

## 2021-06-10 DIAGNOSIS — E7800 Pure hypercholesterolemia, unspecified: Secondary | ICD-10-CM | POA: Diagnosis not present

## 2021-06-10 DIAGNOSIS — M810 Age-related osteoporosis without current pathological fracture: Secondary | ICD-10-CM | POA: Diagnosis not present

## 2021-06-10 DIAGNOSIS — Z0000 Encounter for general adult medical examination without abnormal findings: Secondary | ICD-10-CM | POA: Diagnosis not present

## 2021-06-26 DIAGNOSIS — E559 Vitamin D deficiency, unspecified: Secondary | ICD-10-CM | POA: Diagnosis not present

## 2021-06-26 DIAGNOSIS — E1121 Type 2 diabetes mellitus with diabetic nephropathy: Secondary | ICD-10-CM | POA: Diagnosis not present

## 2021-06-26 DIAGNOSIS — I10 Essential (primary) hypertension: Secondary | ICD-10-CM | POA: Diagnosis not present

## 2021-06-26 DIAGNOSIS — E7800 Pure hypercholesterolemia, unspecified: Secondary | ICD-10-CM | POA: Diagnosis not present

## 2021-06-26 DIAGNOSIS — M810 Age-related osteoporosis without current pathological fracture: Secondary | ICD-10-CM | POA: Diagnosis not present

## 2021-06-26 DIAGNOSIS — Z794 Long term (current) use of insulin: Secondary | ICD-10-CM | POA: Diagnosis not present

## 2021-07-01 ENCOUNTER — Other Ambulatory Visit: Payer: Self-pay | Admitting: Registered Nurse

## 2021-07-01 DIAGNOSIS — E7800 Pure hypercholesterolemia, unspecified: Secondary | ICD-10-CM

## 2021-07-08 ENCOUNTER — Other Ambulatory Visit (HOSPITAL_COMMUNITY): Payer: Self-pay

## 2021-08-04 ENCOUNTER — Other Ambulatory Visit (HOSPITAL_COMMUNITY): Payer: Self-pay

## 2021-08-11 ENCOUNTER — Other Ambulatory Visit (HOSPITAL_COMMUNITY): Payer: Self-pay

## 2021-08-11 MED ORDER — ROSUVASTATIN CALCIUM 20 MG PO TABS
20.0000 mg | ORAL_TABLET | Freq: Every day | ORAL | 0 refills | Status: DC
  Filled 2021-08-11: qty 70, 70d supply, fill #0
  Filled 2021-08-11: qty 20, 20d supply, fill #0

## 2021-09-24 ENCOUNTER — Other Ambulatory Visit (HOSPITAL_COMMUNITY): Payer: Self-pay

## 2021-09-24 DIAGNOSIS — E559 Vitamin D deficiency, unspecified: Secondary | ICD-10-CM | POA: Diagnosis not present

## 2021-09-24 DIAGNOSIS — I10 Essential (primary) hypertension: Secondary | ICD-10-CM | POA: Diagnosis not present

## 2021-09-24 DIAGNOSIS — E1121 Type 2 diabetes mellitus with diabetic nephropathy: Secondary | ICD-10-CM | POA: Diagnosis not present

## 2021-09-24 DIAGNOSIS — Z794 Long term (current) use of insulin: Secondary | ICD-10-CM | POA: Diagnosis not present

## 2021-09-24 DIAGNOSIS — M810 Age-related osteoporosis without current pathological fracture: Secondary | ICD-10-CM | POA: Diagnosis not present

## 2021-09-24 DIAGNOSIS — E7800 Pure hypercholesterolemia, unspecified: Secondary | ICD-10-CM | POA: Diagnosis not present

## 2021-09-24 MED ORDER — OZEMPIC (0.25 OR 0.5 MG/DOSE) 2 MG/3ML ~~LOC~~ SOPN
0.2500 mg | PEN_INJECTOR | SUBCUTANEOUS | 2 refills | Status: DC
  Filled 2021-09-24: qty 3, 42d supply, fill #0
  Filled 2021-11-18: qty 3, 28d supply, fill #1
  Filled 2021-11-26: qty 3, 42d supply, fill #1
  Filled 2022-05-15: qty 3, 42d supply, fill #2

## 2021-10-08 ENCOUNTER — Other Ambulatory Visit (HOSPITAL_COMMUNITY): Payer: Self-pay

## 2021-10-08 MED ORDER — FARXIGA 10 MG PO TABS
10.0000 mg | ORAL_TABLET | Freq: Every day | ORAL | 3 refills | Status: DC
  Filled 2021-10-08: qty 90, 90d supply, fill #0
  Filled 2021-12-24 – 2021-12-29 (×2): qty 90, 90d supply, fill #1
  Filled 2022-05-15: qty 90, 90d supply, fill #2
  Filled 2022-08-11: qty 90, 90d supply, fill #3

## 2021-10-15 ENCOUNTER — Ambulatory Visit
Admission: RE | Admit: 2021-10-15 | Discharge: 2021-10-15 | Disposition: A | Discharge status: Home / Self Care | Payer: No Typology Code available for payment source | Source: Ambulatory Visit | Attending: Registered Nurse | Admitting: Registered Nurse

## 2021-10-15 DIAGNOSIS — E7800 Pure hypercholesterolemia, unspecified: Secondary | ICD-10-CM

## 2021-10-15 DIAGNOSIS — I700 Atherosclerosis of aorta: Secondary | ICD-10-CM | POA: Diagnosis not present

## 2021-10-20 ENCOUNTER — Telehealth: Payer: Self-pay

## 2021-10-20 NOTE — Telephone Encounter (Signed)
NOTES ATTACHED TO REFERRAL

## 2021-10-21 ENCOUNTER — Ambulatory Visit: Payer: Medicare Other | Attending: Internal Medicine | Admitting: Internal Medicine

## 2021-10-21 ENCOUNTER — Other Ambulatory Visit (HOSPITAL_COMMUNITY): Payer: Self-pay

## 2021-10-21 ENCOUNTER — Encounter: Payer: Self-pay | Admitting: Internal Medicine

## 2021-10-21 VITALS — BP 124/70 | HR 92 | Ht 61.0 in | Wt 117.0 lb

## 2021-10-21 DIAGNOSIS — I2510 Atherosclerotic heart disease of native coronary artery without angina pectoris: Secondary | ICD-10-CM | POA: Diagnosis not present

## 2021-10-21 DIAGNOSIS — I152 Hypertension secondary to endocrine disorders: Secondary | ICD-10-CM | POA: Insufficient documentation

## 2021-10-21 DIAGNOSIS — E1159 Type 2 diabetes mellitus with other circulatory complications: Secondary | ICD-10-CM | POA: Insufficient documentation

## 2021-10-21 DIAGNOSIS — Z794 Long term (current) use of insulin: Secondary | ICD-10-CM | POA: Insufficient documentation

## 2021-10-21 DIAGNOSIS — E1169 Type 2 diabetes mellitus with other specified complication: Secondary | ICD-10-CM | POA: Insufficient documentation

## 2021-10-21 DIAGNOSIS — E785 Hyperlipidemia, unspecified: Secondary | ICD-10-CM | POA: Diagnosis not present

## 2021-10-21 DIAGNOSIS — I700 Atherosclerosis of aorta: Secondary | ICD-10-CM | POA: Diagnosis not present

## 2021-10-21 DIAGNOSIS — E119 Type 2 diabetes mellitus without complications: Secondary | ICD-10-CM | POA: Diagnosis not present

## 2021-10-21 MED ORDER — LOSARTAN POTASSIUM 25 MG PO TABS
25.0000 mg | ORAL_TABLET | Freq: Every day | ORAL | 3 refills | Status: DC
Start: 2021-10-21 — End: 2022-09-28
  Filled 2021-10-21: qty 90, 90d supply, fill #0
  Filled 2021-12-24 – 2021-12-25 (×2): qty 90, 90d supply, fill #1

## 2021-10-21 NOTE — Patient Instructions (Signed)
Medication Instructions:  Your physician has recommended you make the following change in your medication:  1.) stop amlodipine 2.) start losartan 25 mg - one tablet daily at bedtime  *If you need a refill on your cardiac medications before your next appointment, please call your pharmacy*   Lab Work: Please return in one week for blood work (Lp(a) and bmet)  If you have labs (blood work) drawn today and your tests are completely normal, you will receive your results only by: [employer] (if you have MyChart) OR A paper copy in the mail If you have any lab test that is abnormal or we need to change your treatment, we will call you to review the results.   Testing/Procedures: Your physician has requested that you have an echocardiogram. Echocardiography is a painless test that uses sound waves to create images of your heart. It provides your doctor with information about the size and shape of your heart and how well your heart's chambers and valves are working. This procedure takes approximately one hour. There are no restrictions for this procedure.   Follow-Up: At [facility], you and your health needs are our priority.  As part of our continuing mission to provide you with exceptional heart care, we have created designated Provider Care Teams.  These Care Teams include your primary Cardiologist (physician) and Advanced Practice Providers (APPs -  Physician Assistants and Nurse Practitioners) who all work together to provide you with the care you need, when you need it.  We recommend signing up for the patient portal called "MyChart".  Sign up information is provided on this After Visit Summary.  MyChart is used to connect with patients for Virtual Visits (Telemedicine).  Patients are able to view lab/test results, encounter notes, upcoming appointments, etc.  Non-urgent messages can be sent to your provider as well.   To learn more about what you can do with MyChart, go to  [email or web address].    Your next appointment:   6 month(s)  The format for your next appointment:   In Person  Provider:   Early Osmond, MD      Important Information About Sugar

## 2021-10-21 NOTE — Progress Notes (Signed)
Cardiology Office Note:    Date:  10/21/2021   ID:  Bethany Ortiz, DOB 05-Dec-1945, MRN [id number]  PCP:  Patient, No Pcp Per   [facility] Cardiologist:  Lenna Sciara, MD Referring MD: Holland Commons, FNP   Chief Complaint/Reason for Referral: Elevated calcium score  ASSESSMENT:    1. Coronary artery calcification seen on CAT scan   2. Aortic atherosclerosis ([place])   3. Type 2 diabetes mellitus without complication, with long-term current use of insulin ([place])   4. Hypertension associated with diabetes ([place])   5. Hyperlipidemia associated with type 2 diabetes mellitus ([place])     PLAN:    In order of problems listed above: 1.  Coronary artery calcification: She is completely asymptomatic.  We will obtain an echocardiogram to evaluate further.  Follow-up in 6 months or earlier if needed. 2.  Aortic atherosclerosis: Continue aspirin, Crestor, and strict blood pressure control. 3.  Type 2 diabetes: Continue aspirin, start losartan 25 mg daily, Crestor, and [place]. 4.  Hypertension: Stop amlodipine and start losartan 25 mg daily for renal protection and diabetic; check BMP next week 5.  Hyperlipidemia: Patient's LDL recently was 44 and LFTs are within normal limits; will check LP(a) next week.          Dispo:  Return in about 6 months (around 04/22/2022).      Medication Adjustments/Labs and Tests Ordered: Current medicines are reviewed at length with the patient today.  Concerns regarding medicines are outlined above.  The following changes have been made:     Labs/tests ordered: Orders Placed This Encounter  Procedures   Lipoprotein A (LPA)   Basic metabolic panel   EKG 59-DGLO   ECHOCARDIOGRAM COMPLETE    Medication Changes: Meds ordered this encounter  Medications   losartan (COZAAR) 25 MG tablet    Sig: Take 1 tablet (25 mg total) by mouth at bedtime.    Dispense:  90 tablet    Refill:  3    Stop amlodipine     Current medicines are  reviewed at length with the patient today.  The patient does not have concerns regarding medicines.   History of Present Illness:    FOCUSED PROBLEM LIST:   1.  Elevated calcium score and three-vessel calcification seen on CT 2023 2.  Aortic atherosclerosis seen on CT 2023 3.  T2DM on insulin 4.  Hyperlipidemia 5.  Hypertension  The patient is a 76 y.o. female with the indicated medical history here for recommendations regarding elevated calcium score.  The patient is here with her daughter.  She tells me that she is completely asymptomatic.  She is able to do all of her activities of daily living without any issues.  She denies any exertional angina, exertional dyspnea, presyncope, syncope, palpitations, paroxysmal nocturnal dyspnea, orthopnea.  She denies any myalgias or arthralgias in response to her statin.  She has required no emergency room visits or hospitalizations.  She does not smoke.      Current Medications: Current Meds  Medication Sig   calcium carbonate (OS-CAL - DOSED IN MG OF ELEMENTAL CALCIUM) 1250 (500 Ca) MG tablet Take 1 tablet by mouth daily at 2 PM.   dapagliflozin propanediol (FARXIGA) 10 MG TABS tablet Take 1 tablet (10 mg total) by mouth daily.   Insulin Glargine (BASAGLAR KWIKPEN) 100 UNIT/ML Inject 20 Units into the skin daily.If any blood glucose less than 80 then further decrease to 18 units   linagliptin (TRADJENTA) 5 MG TABS tablet Take  1 tablet (5 mg total) by mouth daily.   losartan (COZAAR) 25 MG tablet Take 1 tablet (25 mg total) by mouth at bedtime.   metFORMIN (GLUCOPHAGE) 500 MG tablet Take 2 tablets (1,000 mg total) by mouth 2 (two) times daily with a meal.   ofloxacin (OCUFLOX) 0.3 % ophthalmic solution Place 1 drop into the right eye 4 (four) times daily. Begin one day prior to surgery, continue as directed.   rosuvastatin (CRESTOR) 20 MG tablet Take 1 tablet (20 mg total) by mouth daily.   Semaglutide,0.25 or 0.'5MG'$ /DOS, (OZEMPIC, 0.25 OR 0.5  MG/DOSE,) 2 MG/3ML SOPN Inject 0.25 mg into the skin once a week for 4 weeks then increase to 0.'5mg'$  weekly   [DISCONTINUED] amLODipine (NORVASC) 10 MG tablet Take 1 tablet (10 mg total) by mouth daily.     Allergies:    Patient has no known allergies.   Social History:   Social History   Tobacco Use   Smoking status: Never   Smokeless tobacco: Never  Vaping Use   Vaping Use: Never used  Substance Use Topics   Alcohol use: Never   Drug use: Never     Family Hx: Family History  Problem Relation Age of Onset   Cancer Father    Diabetes Sister    Diabetes Brother    Hypertension Brother    Cancer Brother    Stroke Brother    Healthy Mother      Review of Systems:   Please see the history of present illness.    All other systems reviewed and are negative.     EKGs/Labs/Other Test Reviewed:    EKG:  EKG performed today that I personally reviewed demonstrates sinus rhythm with first-degree AV block and possible septal infarction.  Prior CV studies:  Calcium score CT 2023 1. Severe 3 vessel coronary atherosclerotic calcifications. The observed calcium score of 3,699 is at the ninety-ninth percentile for subjects of the same age, gender, and race/ethnicity who are free of clinical cardiovascular disease and treated diabetes. 2. Indeterminate 6 mm solid pulmonary nodule in the medial aspect of the right lower lobe. Non-contrast chest CT at 6-12 months is recommended. If the nodule is stable at time of repeat CT, then future CT at 18-24 months (from today's scan) is considered optional for low-risk patients, but is recommended for high-risk patients. This recommendation follows the consensus statement: Guidelines for Management of Incidental Pulmonary Nodules Detected on CT Images: From the Fleischner Society 2017; Radiology 2017; 284:228-243. 3.  Aortic Atherosclerosis (ICD10-I70.0).   ABIs 2022 normal    Other studies Reviewed: Review of the additional  studies/records demonstrates: None available  Recent Labs: No results found for requested labs within last 365 days.   Recent Lipid Panel No results found for: "CHOL", "TRIG", "HDL", "LDLCALC", "LDLDIRECT"  External labs done recently demonstrated cholesterol 119, triglycerides 64, HDL 62, and LDL 44, hemoglobin was 12.6, LFTs within normal limits, and creatinine was 0.62.  Risk Assessment/Calculations:                Physical Exam:    VS:  BP 124/70   Pulse 92   Ht '5\' 1"'$  (1.549 m)   Wt 117 lb (53.1 kg)   SpO2 98%   BMI 22.11 kg/m    Wt Readings from Last 3 Encounters:  10/21/21 117 lb (53.1 kg)  02/27/20 125 lb (56.7 kg)  12/15/19 116 lb 13.5 oz (53 kg)    GENERAL:  No apparent distress, AOx3 HEENT:  No carotid bruits, +2 carotid impulses, no scleral icterus CAR: RRR no murmurs, gallops, rubs, or thrills RES:  Clear to auscultation bilaterally ABD:  Soft, nontender, nondistended, positive bowel sounds x 4 VASC:  +2 radial pulses, +2 carotid pulses, palpable pedal pulses NEURO:  CN 2-12 grossly intact; motor and sensory grossly intact PSYCH:  No active depression or anxiety EXT:  No edema, ecchymosis, or cyanosis  Signed, Early Osmond, MD  10/21/2021 10:14 AM    [address], [place], [place]  [id number] Phone: [phone]; Fax: [phone]   Note:  This document was prepared using Dragon voice recognition software and may include unintentional dictation errors.

## 2021-10-31 ENCOUNTER — Ambulatory Visit: Payer: Medicare Other | Attending: Internal Medicine

## 2021-10-31 ENCOUNTER — Ambulatory Visit: Payer: Medicare Other

## 2021-10-31 DIAGNOSIS — I152 Hypertension secondary to endocrine disorders: Secondary | ICD-10-CM | POA: Diagnosis not present

## 2021-10-31 DIAGNOSIS — Z794 Long term (current) use of insulin: Secondary | ICD-10-CM | POA: Diagnosis not present

## 2021-10-31 DIAGNOSIS — E119 Type 2 diabetes mellitus without complications: Secondary | ICD-10-CM | POA: Insufficient documentation

## 2021-10-31 DIAGNOSIS — I700 Atherosclerosis of aorta: Secondary | ICD-10-CM | POA: Insufficient documentation

## 2021-10-31 DIAGNOSIS — E1159 Type 2 diabetes mellitus with other circulatory complications: Secondary | ICD-10-CM | POA: Diagnosis not present

## 2021-10-31 DIAGNOSIS — M810 Age-related osteoporosis without current pathological fracture: Secondary | ICD-10-CM | POA: Diagnosis not present

## 2021-10-31 DIAGNOSIS — I2510 Atherosclerotic heart disease of native coronary artery without angina pectoris: Secondary | ICD-10-CM | POA: Insufficient documentation

## 2021-10-31 DIAGNOSIS — E785 Hyperlipidemia, unspecified: Secondary | ICD-10-CM | POA: Diagnosis not present

## 2021-10-31 DIAGNOSIS — E1169 Type 2 diabetes mellitus with other specified complication: Secondary | ICD-10-CM | POA: Insufficient documentation

## 2021-10-31 LAB — ECHOCARDIOGRAM COMPLETE
Area-P 1/2: 6.83 cm2
S' Lateral: 2 cm

## 2021-10-31 MED ORDER — PERFLUTREN LIPID MICROSPHERE
1.0000 mL | INTRAVENOUS | Status: AC | PRN
  Administered 2021-10-31: 2 mL via INTRAVENOUS

## 2021-11-03 LAB — BASIC METABOLIC PANEL
BUN/Creatinine Ratio: 18 (ref 12–28)
BUN: 12 mg/dL (ref 8–27)
CO2: 27 mmol/L (ref 20–29)
Calcium: 10.4 mg/dL — ABNORMAL HIGH (ref 8.7–10.3)
Chloride: 97 mmol/L (ref 96–106)
Creatinine, Ser: 0.65 mg/dL (ref 0.57–1.00)
Glucose: 277 mg/dL — ABNORMAL HIGH (ref 70–99)
Potassium: 4 mmol/L (ref 3.5–5.2)
Sodium: 138 mmol/L (ref 134–144)
eGFR: 91 mL/min/{1.73_m2} (ref >59)

## 2021-11-03 LAB — LIPOPROTEIN A (LPA): Lipoprotein (a): 31.1 nmol/L (ref <75.0)

## 2021-11-06 ENCOUNTER — Other Ambulatory Visit (HOSPITAL_COMMUNITY): Payer: Self-pay

## 2021-11-06 MED ORDER — ROSUVASTATIN CALCIUM 20 MG PO TABS
20.0000 mg | ORAL_TABLET | Freq: Every day | ORAL | 0 refills | Status: DC
  Filled 2021-11-06: qty 90, 90d supply, fill #0

## 2021-11-18 ENCOUNTER — Other Ambulatory Visit (HOSPITAL_COMMUNITY): Payer: Self-pay

## 2021-11-26 ENCOUNTER — Other Ambulatory Visit (HOSPITAL_COMMUNITY): Payer: Self-pay

## 2021-12-04 DIAGNOSIS — E559 Vitamin D deficiency, unspecified: Secondary | ICD-10-CM | POA: Diagnosis not present

## 2021-12-04 DIAGNOSIS — E1121 Type 2 diabetes mellitus with diabetic nephropathy: Secondary | ICD-10-CM | POA: Diagnosis not present

## 2021-12-04 DIAGNOSIS — Z794 Long term (current) use of insulin: Secondary | ICD-10-CM | POA: Diagnosis not present

## 2021-12-04 DIAGNOSIS — M810 Age-related osteoporosis without current pathological fracture: Secondary | ICD-10-CM | POA: Diagnosis not present

## 2021-12-04 DIAGNOSIS — I10 Essential (primary) hypertension: Secondary | ICD-10-CM | POA: Diagnosis not present

## 2021-12-04 DIAGNOSIS — E7800 Pure hypercholesterolemia, unspecified: Secondary | ICD-10-CM | POA: Diagnosis not present

## 2021-12-24 ENCOUNTER — Other Ambulatory Visit (HOSPITAL_COMMUNITY): Payer: Self-pay

## 2021-12-24 DIAGNOSIS — M810 Age-related osteoporosis without current pathological fracture: Secondary | ICD-10-CM | POA: Diagnosis not present

## 2021-12-24 DIAGNOSIS — Z794 Long term (current) use of insulin: Secondary | ICD-10-CM | POA: Diagnosis not present

## 2021-12-24 DIAGNOSIS — I10 Essential (primary) hypertension: Secondary | ICD-10-CM | POA: Diagnosis not present

## 2021-12-24 DIAGNOSIS — E1121 Type 2 diabetes mellitus with diabetic nephropathy: Secondary | ICD-10-CM | POA: Diagnosis not present

## 2021-12-24 DIAGNOSIS — E7800 Pure hypercholesterolemia, unspecified: Secondary | ICD-10-CM | POA: Diagnosis not present

## 2021-12-24 DIAGNOSIS — E1165 Type 2 diabetes mellitus with hyperglycemia: Secondary | ICD-10-CM | POA: Diagnosis not present

## 2021-12-24 DIAGNOSIS — E559 Vitamin D deficiency, unspecified: Secondary | ICD-10-CM | POA: Diagnosis not present

## 2021-12-25 ENCOUNTER — Other Ambulatory Visit (HOSPITAL_COMMUNITY): Payer: Self-pay

## 2021-12-25 MED ORDER — ROSUVASTATIN CALCIUM 20 MG PO TABS
20.0000 mg | ORAL_TABLET | Freq: Every day | ORAL | 0 refills | Status: DC
  Filled 2021-12-25 – 2021-12-26 (×2): qty 90, 90d supply, fill #0

## 2021-12-26 ENCOUNTER — Other Ambulatory Visit (HOSPITAL_COMMUNITY): Payer: Self-pay

## 2021-12-29 ENCOUNTER — Other Ambulatory Visit: Payer: Self-pay

## 2021-12-29 ENCOUNTER — Other Ambulatory Visit (HOSPITAL_COMMUNITY): Payer: Self-pay

## 2021-12-29 MED ORDER — ROSUVASTATIN CALCIUM 40 MG PO TABS
40.0000 mg | ORAL_TABLET | Freq: Every day | ORAL | 3 refills | Status: DC
  Filled 2021-12-29 (×2): qty 90, 90d supply, fill #0
  Filled 2022-02-16: qty 90, 90d supply, fill #1

## 2021-12-29 MED ORDER — ROSUVASTATIN CALCIUM 20 MG PO TABS
20.0000 mg | ORAL_TABLET | Freq: Every day | ORAL | 3 refills | Status: DC
  Filled 2021-12-29: qty 90, 90d supply, fill #0

## 2021-12-30 ENCOUNTER — Other Ambulatory Visit (HOSPITAL_COMMUNITY): Payer: Self-pay

## 2022-01-22 ENCOUNTER — Other Ambulatory Visit (HOSPITAL_COMMUNITY): Payer: Self-pay

## 2022-01-22 MED ORDER — LOSARTAN POTASSIUM 50 MG PO TABS
50.0000 mg | ORAL_TABLET | Freq: Every day | ORAL | 1 refills | Status: DC
  Filled 2022-01-22: qty 90, 90d supply, fill #0
  Filled 2022-03-09: qty 30, 30d supply, fill #1
  Filled 2022-05-15: qty 30, 30d supply, fill #2
  Filled 2022-06-23: qty 30, 30d supply, fill #3

## 2022-02-16 ENCOUNTER — Other Ambulatory Visit (HOSPITAL_COMMUNITY): Payer: Self-pay

## 2022-02-19 DIAGNOSIS — M7918 Myalgia, other site: Secondary | ICD-10-CM | POA: Diagnosis not present

## 2022-02-19 DIAGNOSIS — R5383 Other fatigue: Secondary | ICD-10-CM | POA: Diagnosis not present

## 2022-02-19 DIAGNOSIS — I2510 Atherosclerotic heart disease of native coronary artery without angina pectoris: Secondary | ICD-10-CM | POA: Diagnosis not present

## 2022-02-19 DIAGNOSIS — G629 Polyneuropathy, unspecified: Secondary | ICD-10-CM | POA: Diagnosis not present

## 2022-02-20 ENCOUNTER — Other Ambulatory Visit: Payer: Self-pay

## 2022-02-20 ENCOUNTER — Emergency Department (HOSPITAL_COMMUNITY)
Admission: EM | Admit: 2022-02-20 | Discharge: 2022-02-20 | Disposition: A | Discharge status: Home / Self Care | Payer: Medicare Other | Attending: Emergency Medicine | Admitting: Emergency Medicine

## 2022-02-20 DIAGNOSIS — R945 Abnormal results of liver function studies: Secondary | ICD-10-CM | POA: Diagnosis not present

## 2022-02-20 DIAGNOSIS — M6282 Rhabdomyolysis: Secondary | ICD-10-CM | POA: Insufficient documentation

## 2022-02-20 DIAGNOSIS — Z7984 Long term (current) use of oral hypoglycemic drugs: Secondary | ICD-10-CM | POA: Diagnosis not present

## 2022-02-20 DIAGNOSIS — K759 Inflammatory liver disease, unspecified: Secondary | ICD-10-CM

## 2022-02-20 DIAGNOSIS — Z79899 Other long term (current) drug therapy: Secondary | ICD-10-CM | POA: Insufficient documentation

## 2022-02-20 DIAGNOSIS — I10 Essential (primary) hypertension: Secondary | ICD-10-CM | POA: Insufficient documentation

## 2022-02-20 DIAGNOSIS — R748 Abnormal levels of other serum enzymes: Secondary | ICD-10-CM | POA: Insufficient documentation

## 2022-02-20 DIAGNOSIS — Z794 Long term (current) use of insulin: Secondary | ICD-10-CM | POA: Insufficient documentation

## 2022-02-20 DIAGNOSIS — E119 Type 2 diabetes mellitus without complications: Secondary | ICD-10-CM | POA: Diagnosis not present

## 2022-02-20 DIAGNOSIS — R5383 Other fatigue: Secondary | ICD-10-CM | POA: Diagnosis present

## 2022-02-20 LAB — COMPREHENSIVE METABOLIC PANEL
ALT: 158 U/L — ABNORMAL HIGH (ref 0–44)
AST: 112 U/L — ABNORMAL HIGH (ref 15–41)
Albumin: 3.7 g/dL (ref 3.5–5.0)
Alkaline Phosphatase: 36 U/L — ABNORMAL LOW (ref 38–126)
Anion gap: 7 (ref 5–15)
BUN: 17 mg/dL (ref 8–23)
CO2: 25 mmol/L (ref 22–32)
Calcium: 8.7 mg/dL — ABNORMAL LOW (ref 8.9–10.3)
Chloride: 103 mmol/L (ref 98–111)
Creatinine, Ser: 0.68 mg/dL (ref 0.44–1.00)
GFR, Estimated: >60 mL/min (ref >60)
Glucose, Bld: 138 mg/dL — ABNORMAL HIGH (ref 70–99)
Potassium: 3.8 mmol/L (ref 3.5–5.1)
Sodium: 135 mmol/L (ref 135–145)
Total Bilirubin: 0.6 mg/dL (ref 0.3–1.2)
Total Protein: 6.8 g/dL (ref 6.5–8.1)

## 2022-02-20 LAB — URINALYSIS, ROUTINE W REFLEX MICROSCOPIC
Bacteria, UA: NONE SEEN
Bilirubin Urine: NEGATIVE
Glucose, UA: >=500 mg/dL — AB
Ketones, ur: NEGATIVE mg/dL
Leukocytes,Ua: NEGATIVE
Nitrite: NEGATIVE
Protein, ur: 30 mg/dL — AB
Specific Gravity, Urine: 1.012 (ref 1.005–1.030)
pH: 7 (ref 5.0–8.0)

## 2022-02-20 LAB — CBC WITH DIFFERENTIAL/PLATELET
Abs Immature Granulocytes: 0.03 10*3/uL (ref 0.00–0.07)
Basophils Absolute: 0 10*3/uL (ref 0.0–0.1)
Basophils Relative: 0 %
Eosinophils Absolute: 0.2 10*3/uL (ref 0.0–0.5)
Eosinophils Relative: 3 %
HCT: 39.1 % (ref 36.0–46.0)
Hemoglobin: 12.6 g/dL (ref 12.0–15.0)
Immature Granulocytes: 0 %
Lymphocytes Relative: 22 %
Lymphs Abs: 1.6 10*3/uL (ref 0.7–4.0)
MCH: 26.2 pg (ref 26.0–34.0)
MCHC: 32.2 g/dL (ref 30.0–36.0)
MCV: 81.3 fL (ref 80.0–100.0)
Monocytes Absolute: 0.4 10*3/uL (ref 0.1–1.0)
Monocytes Relative: 6 %
Neutro Abs: 5 10*3/uL (ref 1.7–7.7)
Neutrophils Relative %: 69 %
Platelets: 295 10*3/uL (ref 150–400)
RBC: 4.81 MIL/uL (ref 3.87–5.11)
RDW: 15.8 % — ABNORMAL HIGH (ref 11.5–15.5)
WBC: 7.3 10*3/uL (ref 4.0–10.5)
nRBC: 0 % (ref 0.0–0.2)

## 2022-02-20 LAB — LIPASE, BLOOD: Lipase: 54 U/L — ABNORMAL HIGH (ref 11–51)

## 2022-02-20 LAB — CK: Total CK: 3190 U/L — ABNORMAL HIGH (ref 38–234)

## 2022-02-20 MED ORDER — SODIUM CHLORIDE 0.9 % IV BOLUS
1000.0000 mL | Freq: Once | INTRAVENOUS | Status: AC
  Administered 2022-02-20: 1000 mL via INTRAVENOUS

## 2022-02-20 NOTE — Discharge Instructions (Signed)
Follow-up with your primary care doctor on Monday to have your blood work rechecked.  Do not take your cholesterol medicine until cleared by your primary care doctor.  Return to the emergency room if you have any worsening symptoms.

## 2022-02-20 NOTE — ED Triage Notes (Signed)
Pt sent from PCP due to abnormal LFT due to doubling dose of cholesterol meds.  Meds stopped yesterday.  ALT 234 AST143 Large amount of protein in urine.

## 2022-02-20 NOTE — ED Provider Notes (Signed)
[place] EMERGENCY DEPARTMENT AT [facility] Provider Note   CSN: [id number] Arrival date & time: 02/20/22  1439     History  Chief Complaint  Patient presents with   abnormal labs    Bethany Ortiz is a 77 y.o. female.  Patient is a 77 year old female who presents with some abnormal blood work.  She has a history of diabetes, hypertension and hyperlipidemia.  She notes that she previously was on Crestor and per chart review, it looks like it was supposed to be 20 mg once a day but she was taking 20 mg twice daily.  Recently about 2 weeks ago, her PCP increased it to 40 mg once a day but she understood it to be 40 mg twice a day which is what she started taking.  She shortly after began having muscle pains and fatigue.  She had some pains primarily in her thighs.  She went to her PCP yesterday who did some blood work and noted her LFTs to be elevated and her CK to be elevated at 4500.  She was sent here for further evaluation.  She says overall she feels better and her muscle pains have resolved.  She stopped taking Crestor yesterday.  She overall feels better.  She denies any nausea or vomiting.  No abdominal pain.  No fevers.       Home Medications Prior to Admission medications   Medication Sig Start Date End Date Taking? Authorizing Provider  calcium carbonate (OS-CAL - DOSED IN MG OF ELEMENTAL CALCIUM) 1250 (500 Ca) MG tablet Take 1 tablet by mouth daily at 2 PM.    [provider]  dapagliflozin propanediol (FARXIGA) 10 MG TABS tablet Take 1 tablet (10 mg total) by mouth daily. 10/08/21     Insulin Glargine (BASAGLAR KWIKPEN) 100 UNIT/ML Inject 20 Units into the skin daily.If any blood glucose less than 80 then further decrease to 18 units 05/07/21     linagliptin (TRADJENTA) 5 MG TABS tablet Take 1 tablet (5 mg total) by mouth daily. 05/15/21     losartan (COZAAR) 25 MG tablet Take 1 tablet (25 mg total) by mouth at bedtime. 10/21/21   Early Osmond, MD   losartan (COZAAR) 50 MG tablet Take 1 tablet (50 mg total) by mouth daily. 01/22/22     metFORMIN (GLUCOPHAGE) 500 MG tablet Take 2 tablets (1,000 mg total) by mouth 2 (two) times daily with a meal. 05/15/21     ofloxacin (OCUFLOX) 0.3 % ophthalmic solution Place 1 drop into the right eye 4 (four) times daily. Begin one day prior to surgery, continue as directed. 02/11/21     rosuvastatin (CRESTOR) 20 MG tablet Take 1 tablet (20 mg total) by mouth daily. 12/25/21     rosuvastatin (CRESTOR) 20 MG tablet Take 1 tablet (20 mg total) by mouth daily. 12/29/21     rosuvastatin (CRESTOR) 40 MG tablet Take 1 tablet (40 mg total) by mouth daily. 12/29/21     Semaglutide,0.25 or 0.'5MG'$ /DOS, (OZEMPIC, 0.25 OR 0.5 MG/DOSE,) 2 MG/3ML SOPN Inject 0.25 mg into the skin once a week for 4 weeks then increase to 0.'5mg'$  weekly 09/24/21         Allergies    Patient has no known allergies.    Review of Systems   Review of Systems  Constitutional:  Positive for fatigue. Negative for chills, diaphoresis and fever.  HENT:  Negative for congestion, rhinorrhea and sneezing.   Eyes: Negative.   Respiratory:  Negative for cough,  chest tightness and shortness of breath.   Cardiovascular:  Negative for chest pain and leg swelling.  Gastrointestinal:  Negative for abdominal pain, blood in stool, diarrhea, nausea and vomiting.  Genitourinary:  Negative for difficulty urinating, flank pain, frequency and hematuria.  Musculoskeletal:  Positive for myalgias. Negative for arthralgias and back pain.  Skin:  Negative for rash.  Neurological:  Negative for dizziness, speech difficulty, weakness, numbness and headaches.    Physical Exam Updated Vital Signs BP (!) 168/83 (BP Location: Right Arm)   Pulse 90   Temp 98 F (36.7 C) (Oral)   Resp 18   Wt 53 kg   SpO2 99%   BMI 22.08 kg/m  Physical Exam Constitutional:      Appearance: She is well-developed.  HENT:     Head: Normocephalic and atraumatic.  Eyes:     Pupils:  Pupils are equal, round, and reactive to light.  Cardiovascular:     Rate and Rhythm: Normal rate and regular rhythm.     Heart sounds: Normal heart sounds.  Pulmonary:     Effort: Pulmonary effort is normal. No respiratory distress.     Breath sounds: Normal breath sounds. No wheezing or rales.  Chest:     Chest wall: No tenderness.  Abdominal:     General: Bowel sounds are normal.     Palpations: Abdomen is soft.     Tenderness: There is no abdominal tenderness. There is no guarding or rebound.  Musculoskeletal:        General: Normal range of motion.     Cervical back: Normal range of motion and neck supple.     Comments: No swelling noted to the legs.  Pedal pulses are intact.  She has normal sensation and motor function distally.  Lymphadenopathy:     Cervical: No cervical adenopathy.  Skin:    General: Skin is warm and dry.     Findings: No rash.  Neurological:     Mental Status: She is alert and oriented to person, place, and time.     ED Results / Procedures / Treatments   Labs (all labs ordered are listed, but only abnormal results are displayed) Labs Reviewed  CBC WITH DIFFERENTIAL/PLATELET - Abnormal; Notable for the following components:      Result Value   RDW 15.8 (*)    All other components within normal limits  COMPREHENSIVE METABOLIC PANEL - Abnormal; Notable for the following components:   Glucose, Bld 138 (*)    Calcium 8.7 (*)    AST 112 (*)    ALT 158 (*)    Alkaline Phosphatase 36 (*)    All other components within normal limits  LIPASE, BLOOD - Abnormal; Notable for the following components:   Lipase 54 (*)    All other components within normal limits  URINALYSIS, ROUTINE W REFLEX MICROSCOPIC - Abnormal; Notable for the following components:   Color, Urine STRAW (*)    Glucose, UA >=500 (*)    Hgb urine dipstick LARGE (*)    Protein, ur 30 (*)    All other components within normal limits  CK - Abnormal; Notable for the following components:    Total CK 3,190 (*)    All other components within normal limits    EKG None  Radiology No results found.  Procedures Procedures    Medications Ordered in ED Medications  sodium chloride 0.9 % bolus 1,000 mL (1,000 mLs Intravenous New Bag/Given 02/20/22 1655)    ED Course/ Medical  Decision Making/ A&P                             Medical Decision Making Amount and/or Complexity of Data Reviewed Labs: ordered.   Patient is a 77 year old female who presents with some abnormal blood work after taking a double dose of her Crestor.  I reviewed the records from her PCP.  She had an elevated CK of a slightly over 4500.  She had elevated LFTs with an ALT of 234 and AST of 243.  She was sent here for further evaluation.  Currently she is asymptomatic.  Her myalgias have resolved.  She was given a liter of IV fluids.  On her blood work today, it seems like all of her labs are improving.  Her CK is down in the 3000's.  Her liver enzymes have improved.  Her creatinine is normal.  Her glucose dropped to about 69 while she was here in the ED.  She was given juice and crackers and it improved.  She is otherwise asymptomatic.  She was discharged home in good condition.  She was advised to follow-up with her PCP on Monday to have her labs rechecked to make sure that they have come back to normal.  She was advised to not restart her Crestor until cleared by her PCP.  Final Clinical Impression(s) / ED Diagnoses Final diagnoses:  Hepatitis  Non-traumatic rhabdomyolysis    Rx / DC Orders ED Discharge Orders     None         Malvin Johns, MD 02/20/22 1821

## 2022-02-20 NOTE — ED Provider Triage Note (Signed)
Emergency Medicine Provider Triage Evaluation Note  Bethany Ortiz , a 77 y.o. female  was evaluated in triage.  Pt complains of abnormal lab.  Was sent by her PCP because her LFTs are abnormal.  Patient mentioned that she has been taking twice her dose of her statin accidentally for the past 2 weeks.  Denies abdominal pain denies.  Denies hematemesis and bloody stools.  Did state that she did have some blood in her urine but no other urinary symptoms.  Review of Systems  Positive: See above Negative: See above  Physical Exam  BP (!) 166/86   Pulse 97   Temp 98.3 F (36.8 C)   Resp 20   Wt 53 kg   SpO2 99%   BMI 22.08 kg/m  Gen:   Awake, no distress   Resp:  Normal effort  MSK:   Moves extremities without difficulty  Other:    Medical Decision Making  Medically screening exam initiated at 3:11 PM.  Appropriate orders placed.  Micole Delehanty was informed that the remainder of the evaluation will be completed by another provider, this initial triage assessment does not replace that evaluation, and the importance of remaining in the ED until their evaluation is complete.  Work up started   Harriet Pho, PA-C 02/20/22 1513

## 2022-02-23 DIAGNOSIS — R3129 Other microscopic hematuria: Secondary | ICD-10-CM | POA: Diagnosis not present

## 2022-02-23 DIAGNOSIS — R748 Abnormal levels of other serum enzymes: Secondary | ICD-10-CM | POA: Diagnosis not present

## 2022-02-23 DIAGNOSIS — E538 Deficiency of other specified B group vitamins: Secondary | ICD-10-CM | POA: Diagnosis not present

## 2022-02-23 DIAGNOSIS — R7989 Other specified abnormal findings of blood chemistry: Secondary | ICD-10-CM | POA: Diagnosis not present

## 2022-02-23 DIAGNOSIS — Z23 Encounter for immunization: Secondary | ICD-10-CM | POA: Diagnosis not present

## 2022-03-05 DIAGNOSIS — R748 Abnormal levels of other serum enzymes: Secondary | ICD-10-CM | POA: Diagnosis not present

## 2022-03-05 DIAGNOSIS — R3129 Other microscopic hematuria: Secondary | ICD-10-CM | POA: Diagnosis not present

## 2022-03-09 ENCOUNTER — Other Ambulatory Visit (HOSPITAL_COMMUNITY): Payer: Self-pay

## 2022-03-09 ENCOUNTER — Other Ambulatory Visit: Payer: Self-pay

## 2022-03-09 DIAGNOSIS — E1121 Type 2 diabetes mellitus with diabetic nephropathy: Secondary | ICD-10-CM | POA: Diagnosis not present

## 2022-03-09 DIAGNOSIS — R3129 Other microscopic hematuria: Secondary | ICD-10-CM | POA: Diagnosis not present

## 2022-03-09 DIAGNOSIS — Z09 Encounter for follow-up examination after completed treatment for conditions other than malignant neoplasm: Secondary | ICD-10-CM | POA: Diagnosis not present

## 2022-03-09 DIAGNOSIS — G720 Drug-induced myopathy: Secondary | ICD-10-CM | POA: Diagnosis not present

## 2022-03-09 DIAGNOSIS — R748 Abnormal levels of other serum enzymes: Secondary | ICD-10-CM | POA: Diagnosis not present

## 2022-03-09 DIAGNOSIS — T466X5A Adverse effect of antihyperlipidemic and antiarteriosclerotic drugs, initial encounter: Secondary | ICD-10-CM | POA: Diagnosis not present

## 2022-03-09 DIAGNOSIS — R3121 Asymptomatic microscopic hematuria: Secondary | ICD-10-CM | POA: Diagnosis not present

## 2022-03-09 MED ORDER — BASAGLAR KWIKPEN 100 UNIT/ML ~~LOC~~ SOPN
15.0000 [IU] | PEN_INJECTOR | Freq: Every day | SUBCUTANEOUS | 1 refills | Status: DC
  Filled 2022-03-09: qty 15, 100d supply, fill #0
  Filled 2022-07-25 (×2): qty 15, 100d supply, fill #1

## 2022-03-11 ENCOUNTER — Other Ambulatory Visit (HOSPITAL_COMMUNITY): Payer: Self-pay

## 2022-03-11 ENCOUNTER — Other Ambulatory Visit: Payer: Self-pay

## 2022-05-15 ENCOUNTER — Other Ambulatory Visit (HOSPITAL_COMMUNITY): Payer: Self-pay

## 2022-05-15 ENCOUNTER — Other Ambulatory Visit: Payer: Self-pay

## 2022-05-18 ENCOUNTER — Other Ambulatory Visit (HOSPITAL_COMMUNITY): Payer: Self-pay

## 2022-05-18 MED ORDER — METFORMIN HCL 500 MG PO TABS
1000.0000 mg | ORAL_TABLET | Freq: Two times a day (BID) | ORAL | 3 refills | Status: DC
  Filled 2022-05-18: qty 360, 90d supply, fill #0
  Filled 2022-08-11: qty 360, 90d supply, fill #1
  Filled 2022-11-10: qty 360, 90d supply, fill #2
  Filled 2023-02-01: qty 360, 90d supply, fill #3

## 2022-05-19 DIAGNOSIS — Z961 Presence of intraocular lens: Secondary | ICD-10-CM | POA: Diagnosis not present

## 2022-05-19 DIAGNOSIS — H3582 Retinal ischemia: Secondary | ICD-10-CM | POA: Diagnosis not present

## 2022-05-19 DIAGNOSIS — E113513 Type 2 diabetes mellitus with proliferative diabetic retinopathy with macular edema, bilateral: Secondary | ICD-10-CM | POA: Diagnosis not present

## 2022-05-26 DIAGNOSIS — I2510 Atherosclerotic heart disease of native coronary artery without angina pectoris: Secondary | ICD-10-CM | POA: Diagnosis not present

## 2022-05-26 DIAGNOSIS — E538 Deficiency of other specified B group vitamins: Secondary | ICD-10-CM | POA: Diagnosis not present

## 2022-05-26 DIAGNOSIS — E1165 Type 2 diabetes mellitus with hyperglycemia: Secondary | ICD-10-CM | POA: Diagnosis not present

## 2022-05-26 DIAGNOSIS — R748 Abnormal levels of other serum enzymes: Secondary | ICD-10-CM | POA: Diagnosis not present

## 2022-05-26 DIAGNOSIS — E559 Vitamin D deficiency, unspecified: Secondary | ICD-10-CM | POA: Diagnosis not present

## 2022-05-26 DIAGNOSIS — Z794 Long term (current) use of insulin: Secondary | ICD-10-CM | POA: Diagnosis not present

## 2022-05-26 DIAGNOSIS — I10 Essential (primary) hypertension: Secondary | ICD-10-CM | POA: Diagnosis not present

## 2022-05-27 ENCOUNTER — Other Ambulatory Visit (HOSPITAL_COMMUNITY): Payer: Self-pay

## 2022-05-27 MED ORDER — ROSUVASTATIN CALCIUM 5 MG PO TABS
5.0000 mg | ORAL_TABLET | Freq: Every day | ORAL | 0 refills | Status: DC
  Filled 2022-05-27: qty 90, 90d supply, fill #0

## 2022-05-29 ENCOUNTER — Other Ambulatory Visit (HOSPITAL_COMMUNITY): Payer: Self-pay

## 2022-05-29 DIAGNOSIS — E113512 Type 2 diabetes mellitus with proliferative diabetic retinopathy with macular edema, left eye: Secondary | ICD-10-CM | POA: Diagnosis not present

## 2022-05-29 MED ORDER — TOBRAMYCIN 0.3 % OP SOLN
1.0000 [drp] | Freq: Four times a day (QID) | OPHTHALMIC | 4 refills | Status: DC
  Filled 2022-05-29: qty 5, 12d supply, fill #0
  Filled 2022-07-22: qty 5, 12d supply, fill #1

## 2022-06-12 DIAGNOSIS — E113513 Type 2 diabetes mellitus with proliferative diabetic retinopathy with macular edema, bilateral: Secondary | ICD-10-CM | POA: Diagnosis not present

## 2022-06-25 DIAGNOSIS — E1121 Type 2 diabetes mellitus with diabetic nephropathy: Secondary | ICD-10-CM | POA: Diagnosis not present

## 2022-06-25 DIAGNOSIS — E7800 Pure hypercholesterolemia, unspecified: Secondary | ICD-10-CM | POA: Diagnosis not present

## 2022-06-25 DIAGNOSIS — E11319 Type 2 diabetes mellitus with unspecified diabetic retinopathy without macular edema: Secondary | ICD-10-CM | POA: Diagnosis not present

## 2022-06-25 DIAGNOSIS — Z794 Long term (current) use of insulin: Secondary | ICD-10-CM | POA: Diagnosis not present

## 2022-06-25 DIAGNOSIS — I10 Essential (primary) hypertension: Secondary | ICD-10-CM | POA: Diagnosis not present

## 2022-07-22 ENCOUNTER — Other Ambulatory Visit: Payer: Self-pay

## 2022-07-22 ENCOUNTER — Other Ambulatory Visit (HOSPITAL_COMMUNITY): Payer: Self-pay

## 2022-07-22 MED ORDER — LOSARTAN POTASSIUM 50 MG PO TABS
50.0000 mg | ORAL_TABLET | Freq: Every day | ORAL | 3 refills | Status: DC
  Filled 2022-07-22: qty 30, 30d supply, fill #0
  Filled 2022-08-11 – 2022-11-10 (×2): qty 30, 30d supply, fill #1
  Filled 2022-12-21: qty 30, 30d supply, fill #2
  Filled 2023-01-21: qty 30, 30d supply, fill #3
  Filled 2023-02-18: qty 30, 30d supply, fill #4
  Filled 2023-03-22: qty 30, 30d supply, fill #5
  Filled 2023-04-20: qty 30, 30d supply, fill #6
  Filled 2023-05-23: qty 30, 30d supply, fill #7

## 2022-07-22 MED ORDER — LOSARTAN POTASSIUM 50 MG PO TABS
50.0000 mg | ORAL_TABLET | Freq: Every day | ORAL | 1 refills | Status: DC
  Filled 2022-07-22 – 2022-08-21 (×3): qty 90, 90d supply, fill #0

## 2022-07-25 ENCOUNTER — Other Ambulatory Visit (HOSPITAL_BASED_OUTPATIENT_CLINIC_OR_DEPARTMENT_OTHER): Payer: Self-pay

## 2022-07-28 ENCOUNTER — Other Ambulatory Visit (HOSPITAL_COMMUNITY): Payer: Self-pay

## 2022-08-03 ENCOUNTER — Other Ambulatory Visit (HOSPITAL_COMMUNITY): Payer: Self-pay

## 2022-08-11 ENCOUNTER — Other Ambulatory Visit (HOSPITAL_COMMUNITY): Payer: Self-pay

## 2022-08-12 DIAGNOSIS — E113513 Type 2 diabetes mellitus with proliferative diabetic retinopathy with macular edema, bilateral: Secondary | ICD-10-CM | POA: Diagnosis not present

## 2022-08-12 DIAGNOSIS — Z961 Presence of intraocular lens: Secondary | ICD-10-CM | POA: Diagnosis not present

## 2022-08-12 DIAGNOSIS — H3582 Retinal ischemia: Secondary | ICD-10-CM | POA: Diagnosis not present

## 2022-08-13 ENCOUNTER — Other Ambulatory Visit (HOSPITAL_COMMUNITY): Payer: Self-pay

## 2022-08-21 ENCOUNTER — Other Ambulatory Visit (HOSPITAL_COMMUNITY): Payer: Self-pay

## 2022-08-24 ENCOUNTER — Other Ambulatory Visit (HOSPITAL_COMMUNITY): Payer: Self-pay

## 2022-08-24 MED ORDER — OZEMPIC (0.25 OR 0.5 MG/DOSE) 2 MG/3ML ~~LOC~~ SOPN
0.5000 mg | PEN_INJECTOR | SUBCUTANEOUS | 3 refills | Status: AC
  Filled 2022-08-24: qty 3, 28d supply, fill #0

## 2022-09-09 ENCOUNTER — Other Ambulatory Visit (HOSPITAL_COMMUNITY): Payer: Self-pay

## 2022-09-09 MED ORDER — ROSUVASTATIN CALCIUM 5 MG PO TABS
5.0000 mg | ORAL_TABLET | Freq: Every day | ORAL | 1 refills | Status: AC
  Filled 2022-09-09: qty 90, 90d supply, fill #0
  Filled 2022-11-10 – 2022-11-16 (×2): qty 90, 90d supply, fill #1

## 2022-09-10 ENCOUNTER — Other Ambulatory Visit (HOSPITAL_COMMUNITY): Payer: Self-pay

## 2022-09-23 NOTE — Progress Notes (Signed)
Cardiology Office Note:   Date:  09/28/2022  ID:  Bethany, Ortiz Apr 27, 1945, MRN [id number] PCP:  Fatima Sanger, FNP  [facility] Providers Cardiologist:  Alverda Skeans, MD Referring MD: No ref. provider found   Chief Complaint/Reason for Referral: Cardiology follow-up ASSESSMENT:    1. Coronary artery calcification seen on CAT scan   2. Aortic atherosclerosis (HCC)   3. Type 2 diabetes mellitus without complication, with long-term current use of insulin (HCC)   4. Hypertension associated with diabetes (HCC)   5. Hyperlipidemia associated with type 2 diabetes mellitus (HCC)     PLAN:   In order of problems listed above: 1.  Coronary artery calcification: Continue aspirin 81 mg daily and statin. 2.  Aortic atherosclerosis: Continue aspirin and statin with strict blood pressure control. 3.  Type 2 diabetes: Continue Farxiga, losartan, Crestor, and Ozempic.  Continue aspirin 81 mg daily 4.  Hypertension: Blood pressure is well-controlled today. 5.  Hyperlipidemia: Will check lipid panel and LFTs today.  Goal LDL is less than 70.  I suspect that her LFTs are within normal limits.      Dispo:  Return in about 1 year (around 09/28/2023).    Medication Adjustments/Labs and Tests Ordered: Current medicines are reviewed at length with the patient today.  Concerns regarding medicines are outlined above.  The following changes have been made:  no change   Labs/tests ordered: Orders Placed This Encounter  Procedures   Lipid panel   Hepatic function panel   EKG 12-Lead    Medication Changes: Meds ordered this encounter  Medications   aspirin EC 81 MG tablet    Sig: Take 1 tablet (81 mg total) by mouth daily. Swallow whole.    Current medicines are reviewed at length with the patient today.  The patient does not have concerns regarding medicines.  History of Present Illness:   FOCUSED PROBLEM LIST:   Elevated calcium score and three-vessel calcification seen on CT  2023 Aortic atherosclerosis on CT 2023 Type 2 diabetes on insulin Hyperlipidemia; low Lp(a) Hypertension BMI 09 November 2021: The patient is a 77 y.o. female with the indicated medical history here for recommendations regarding elevated calcium score.  The patient is here with her daughter.  She tells me that she is completely asymptomatic.  She is able to do all of her activities of daily living without any issues.  She denies any exertional angina, exertional dyspnea, presyncope, syncope, palpitations, paroxysmal nocturnal dyspnea, orthopnea.  She denies any myalgias or arthralgias in response to her statin.  She has required no emergency room visits or hospitalizations.  She does not smoke.  Plan: Obtain echocardiogram, discontinue amlodipine, and start losartan 25 mg; check LP(a).  September 2024: In the interim the patient had an echocardiogram which was reassuring.  Her LP(a) was low.  The patient was seen in the emergency department due to abnormal LFTs in February of this year.  She had misunderstood instructions from her PCP regarding her Crestor dosing.  Her CK was elevated to the 3000's.  She was hydrated and sent home.  She went to [place] for a month with her daughter.  She had no issues there.  She denies any cardiovascular complaints today.  She is able to do all of her activities of daily living without any issues.  She is tolerating her medications well.  She is only on Crestor 5 mg at this point.  Her LDL back in May was above goal at 129.  Current Medications: Current Meds  Medication Sig   aspirin EC 81 MG tablet Take 1 tablet (81 mg total) by mouth daily. Swallow whole.   calcium carbonate (OS-CAL - DOSED IN MG OF ELEMENTAL CALCIUM) 1250 (500 Ca) MG tablet Take 1 tablet by mouth daily at 2 PM.   dapagliflozin propanediol (FARXIGA) 10 MG TABS tablet Take 1 tablet (10 mg total) by mouth daily.   Insulin Glargine (BASAGLAR KWIKPEN) 100 UNIT/ML Inject 20 Units into the skin  daily.If any blood glucose less than 80 then further decrease to 18 units   linagliptin (TRADJENTA) 5 MG TABS tablet Take 1 tablet (5 mg total) by mouth daily.   losartan (COZAAR) 50 MG tablet Take 1 tablet (50 mg total) by mouth daily.   metFORMIN (GLUCOPHAGE) 500 MG tablet Take 2 tablets (1,000 mg total) by mouth 2 (two) times daily with a meal.   rosuvastatin (CRESTOR) 5 MG tablet Take 1 tablet (5 mg total) by mouth daily.   Semaglutide,0.25 or 0.5MG /DOS, (OZEMPIC, 0.25 OR 0.5 MG/DOSE,) 2 MG/3ML SOPN Inject 0.5 mg into the skin once a week.     Allergies:    Patient has no known allergies.   Social History:   Social History   Tobacco Use   Smoking status: Never   Smokeless tobacco: Never  Vaping Use   Vaping status: Never Used  Substance Use Topics   Alcohol use: Never   Drug use: Never     Family Hx: Family History  Problem Relation Age of Onset   Cancer Father    Diabetes Sister    Diabetes Brother    Hypertension Brother    Cancer Brother    Stroke Brother    Healthy Mother      Review of Systems:   Please see the history of present illness.    All other systems reviewed and are negative.     EKGs/Labs/Other Test Reviewed:   EKG:   EKG Interpretation Date/Time:  Monday September 28 2022 08:56:43 EDT Ventricular Rate:  96 PR Interval:  204 QRS Duration:  68 QT Interval:  382 QTC Calculation: 482 R Axis:   3  Text Interpretation: Normal sinus rhythm Low voltage QRS Septal infarct , age undetermined When compared with ECG of 03-May-2008 08:13, Septal infarct is now Present Confirmed by Alverda Skeans ([id number]) on 09/28/2022 9:02:09 AM        Prior CV studies reviewed: Cardiac Studies & Procedures       ECHOCARDIOGRAM  ECHOCARDIOGRAM COMPLETE 10/31/2021  Narrative ECHOCARDIOGRAM REPORT    Patient Name:   Bethany Ortiz Date of Exam: 10/31/2021 Medical Rec #:  [id number]        Height:       61.0 in Accession #:    [id number]       Weight:        117.0 lb Date of Birth:  March 03, 1945        BSA:          1.504 m Patient Age:    77 years         BP:           124/70 mmHg Patient Gender: F                HR:           94 bpm. Exam Location:  [address]  Procedure: 2D Echo, Cardiac Doppler, Color Doppler and Intracardiac Opacification Agent  Indications:    I25.10 Coronary artery disease;  History:        Patient has no prior history of Echocardiogram examinations. Risk Factors:Hypertension, Diabetes and Dyslipidemia. Aortic atherosclerosclerosis.  Sonographer:    Cathie Beams RCS Referring Phys: [id number] Orbie Pyo  IMPRESSIONS   1. Left ventricular ejection fraction, by estimation, is 65 to 70%. The left ventricle has normal function. The left ventricle has no regional wall motion abnormalities. There is mild asymmetric left ventricular hypertrophy of the basal-septal segment. Left ventricular diastolic parameters are consistent with Grade I diastolic dysfunction (impaired relaxation). 2. Right ventricular systolic function is normal. The right ventricular size is normal. 3. The mitral valve is normal in structure. Trivial mitral valve regurgitation. No evidence of mitral stenosis. 4. The aortic valve is tricuspid. There is mild calcification of the aortic valve. Aortic valve regurgitation is not visualized. No aortic stenosis is present. 5. The inferior vena cava is normal in size with greater than 50% respiratory variability, suggesting right atrial pressure of 3 mmHg.  FINDINGS Left Ventricle: Left ventricular ejection fraction, by estimation, is 65 to 70%. The left ventricle has normal function. The left ventricle has no regional wall motion abnormalities. The left ventricular internal cavity size was normal in size. There is mild asymmetric left ventricular hypertrophy of the basal-septal segment. Left ventricular diastolic parameters are consistent with Grade I diastolic dysfunction (impaired relaxation).  Right  Ventricle: The right ventricular size is normal. No increase in right ventricular wall thickness. Right ventricular systolic function is normal.  Left Atrium: Left atrial size was normal in size.  Right Atrium: Right atrial size was normal in size.  Pericardium: There is no evidence of pericardial effusion.  Mitral Valve: The mitral valve is normal in structure. Trivial mitral valve regurgitation. No evidence of mitral valve stenosis.  Tricuspid Valve: The tricuspid valve is normal in structure. Tricuspid valve regurgitation is mild . No evidence of tricuspid stenosis.  Aortic Valve: The aortic valve is tricuspid. There is mild calcification of the aortic valve. Aortic valve regurgitation is not visualized. No aortic stenosis is present.  Pulmonic Valve: The pulmonic valve was normal in structure. Pulmonic valve regurgitation is trivial. No evidence of pulmonic stenosis.  Aorta: The aortic root is normal in size and structure.  Venous: The inferior vena cava is normal in size with greater than 50% respiratory variability, suggesting right atrial pressure of 3 mmHg.  IAS/Shunts: No atrial level shunt detected by color flow Doppler.   LEFT VENTRICLE PLAX 2D LVIDd:         3.60 cm   Diastology LVIDs:         2.00 cm   LV e' medial:    6.53 cm/s LV PW:         0.90 cm   LV E/e' medial:  10.9 LV IVS:        1.10 cm   LV e' lateral:   6.96 cm/s LVOT diam:     1.90 cm   LV E/e' lateral: 10.2 LV SV:         43 LV SV Index:   28 LVOT Area:     2.84 cm   RIGHT VENTRICLE RV S prime:     11.20 cm/s TAPSE (M-mode): 2.0 cm  LEFT ATRIUM             Index        RIGHT ATRIUM          Index LA diam:        3.20 cm 2.13 cm/m  RA Area:     8.59 cm LA Vol (A2C):   18.1 ml 12.04 ml/m  RA Volume:   13.70 ml 9.11 ml/m LA Vol (A4C):   20.0 ml 13.30 ml/m LA Biplane Vol: 22.2 ml 14.76 ml/m AORTIC VALVE LVOT Vmax:   77.00 cm/s LVOT Vmean:  46.700 cm/s LVOT VTI:    0.151 m  AORTA Ao  Root diam: 2.70 cm Ao Asc diam:  3.00 cm  MITRAL VALVE MV Area (PHT): 6.83 cm     SHUNTS MV Decel Time: 111 msec     Systemic VTI:  0.15 m MV E velocity: 71.30 cm/s   Systemic Diam: 1.90 cm MV A velocity: 126.00 cm/s MV E/A ratio:  0.57  Arvilla Meres MD Electronically signed by Arvilla Meres MD Signature Date/Time: 10/31/2021/2:51:42 PM    Final             Recent Labs: 02/20/2022: ALT 158; BUN 17; Creatinine, Ser 0.68; Hemoglobin 12.6; Platelets 295; Potassium 3.8; Sodium 135   Lipid Panel No results found for: "CHOL", "TRIG", "HDL", "CHOLHDL", "VLDL", "LDLCALC", "LDLDIRECT"  Risk Assessment/Calculations:          Physical Exam:   VS:  BP 120/86 (BP Location: Left Arm, Patient Position: Sitting, Cuff Size: Normal)   Pulse 96   Ht 5\' 1"  (1.549 m)   Wt 112 lb (50.8 kg)   SpO2 95%   BMI 21.16 kg/m        Wt Readings from Last 3 Encounters:  09/28/22 112 lb (50.8 kg)  02/20/22 116 lb 13.5 oz (53 kg)  10/21/21 117 lb (53.1 kg)      GENERAL:  No apparent distress, AOx3 HEENT:  No carotid bruits, +2 carotid impulses, no scleral icterus CAR: RRR no murmurs, gallops, rubs, or thrills RES:  Clear to auscultation bilaterally ABD:  Soft, nontender, nondistended, positive bowel sounds x 4 VASC:  +2 radial pulses, +2 carotid pulses NEURO:  CN 2-12 grossly intact; motor and sensory grossly intact PSYCH:  No active depression or anxiety EXT:  No edema, ecchymosis, or cyanosis  Signed, Orbie Pyo, MD  09/28/2022 9:11 AM    [facility] [address] [place], [place], [place]  [address] Phone: [phone]; Fax: [phone]   Note:  This document was prepared using Dragon voice recognition software and may include unintentional dictation errors.

## 2022-09-28 ENCOUNTER — Ambulatory Visit: Payer: Medicare Other | Attending: Internal Medicine | Admitting: Internal Medicine

## 2022-09-28 ENCOUNTER — Encounter: Payer: Self-pay | Admitting: Internal Medicine

## 2022-09-28 VITALS — BP 120/86 | HR 96 | Ht 61.0 in | Wt 112.0 lb

## 2022-09-28 DIAGNOSIS — Z794 Long term (current) use of insulin: Secondary | ICD-10-CM | POA: Insufficient documentation

## 2022-09-28 DIAGNOSIS — I2510 Atherosclerotic heart disease of native coronary artery without angina pectoris: Secondary | ICD-10-CM | POA: Diagnosis not present

## 2022-09-28 DIAGNOSIS — E785 Hyperlipidemia, unspecified: Secondary | ICD-10-CM | POA: Diagnosis not present

## 2022-09-28 DIAGNOSIS — E119 Type 2 diabetes mellitus without complications: Secondary | ICD-10-CM

## 2022-09-28 DIAGNOSIS — I700 Atherosclerosis of aorta: Secondary | ICD-10-CM

## 2022-09-28 DIAGNOSIS — I152 Hypertension secondary to endocrine disorders: Secondary | ICD-10-CM | POA: Insufficient documentation

## 2022-09-28 DIAGNOSIS — E1159 Type 2 diabetes mellitus with other circulatory complications: Secondary | ICD-10-CM

## 2022-09-28 DIAGNOSIS — E1169 Type 2 diabetes mellitus with other specified complication: Secondary | ICD-10-CM | POA: Diagnosis not present

## 2022-09-28 MED ORDER — ASPIRIN 81 MG PO TBEC: 81.0000 mg | DELAYED_RELEASE_TABLET | Freq: Every day | ORAL | Status: AC

## 2022-09-28 NOTE — Patient Instructions (Signed)
Medication Instructions:  Your physician recommends that you continue on your current medications as directed. Please refer to the Current Medication list given to you today.  *If you need a refill on your cardiac medications before your next appointment, please call your pharmacy*  Lab Work: TODAY: Lipid panel, LFTs If you have labs (blood work) drawn today and your tests are completely normal, you will receive your results only by: MyChart Message (if you have MyChart) OR A paper copy in the mail If you have any lab test that is abnormal or we need to change your treatment, we will call you to review the results.  Testing/Procedures: None ordered today.  Follow-Up: At [facility], you and your health needs are our priority.  As part of our continuing mission to provide you with exceptional heart care, we have created designated Provider Care Teams.  These Care Teams include your primary Cardiologist (physician) and Advanced Practice Providers (APPs -  Physician Assistants and Nurse Practitioners) who all work together to provide you with the care you need, when you need it.  Your next appointment:   1 year(s)  The format for your next appointment:   In Person  Provider:   Jari Favre, PA-C, Robin Searing, NP, or Tereso Newcomer, PA-C

## 2022-09-29 LAB — HEPATIC FUNCTION PANEL
ALT: 11 IU/L (ref 0–32)
AST: 13 IU/L (ref 0–40)
Albumin: 4.3 g/dL (ref 3.8–4.8)
Alkaline Phosphatase: 43 IU/L — ABNORMAL LOW (ref 44–121)
Bilirubin Total: 0.5 mg/dL (ref 0.0–1.2)
Bilirubin, Direct: 0.15 mg/dL (ref 0.00–0.40)
Total Protein: 6.8 g/dL (ref 6.0–8.5)

## 2022-09-29 LAB — LIPID PANEL
Chol/HDL Ratio: 2.2 ratio (ref 0.0–4.4)
Cholesterol, Total: 120 mg/dL (ref 100–199)
HDL: 54 mg/dL (ref >39)
LDL Chol Calc (NIH): 42 mg/dL (ref 0–99)
Triglycerides: 140 mg/dL (ref 0–149)
VLDL Cholesterol Cal: 24 mg/dL (ref 5–40)

## 2022-10-14 DIAGNOSIS — H3582 Retinal ischemia: Secondary | ICD-10-CM | POA: Diagnosis not present

## 2022-10-14 DIAGNOSIS — Z961 Presence of intraocular lens: Secondary | ICD-10-CM | POA: Diagnosis not present

## 2022-10-14 DIAGNOSIS — E113513 Type 2 diabetes mellitus with proliferative diabetic retinopathy with macular edema, bilateral: Secondary | ICD-10-CM | POA: Diagnosis not present

## 2022-10-20 ENCOUNTER — Other Ambulatory Visit (HOSPITAL_COMMUNITY): Payer: Self-pay

## 2022-10-20 MED ORDER — BASAGLAR KWIKPEN 100 UNIT/ML ~~LOC~~ SOPN
15.0000 [IU] | PEN_INJECTOR | Freq: Every day | SUBCUTANEOUS | 1 refills | Status: DC
  Filled 2022-10-20: qty 15, 100d supply, fill #0
  Filled 2023-02-18: qty 15, 100d supply, fill #1
  Filled 2023-10-14 (×2): qty 3, 20d supply, fill #1

## 2022-11-10 ENCOUNTER — Other Ambulatory Visit: Payer: Self-pay

## 2022-11-10 ENCOUNTER — Other Ambulatory Visit (HOSPITAL_COMMUNITY): Payer: Self-pay

## 2022-11-10 MED ORDER — DAPAGLIFLOZIN PROPANEDIOL 10 MG PO TABS
10.0000 mg | ORAL_TABLET | Freq: Every day | ORAL | 3 refills | Status: AC
  Filled 2022-11-10: qty 30, 30d supply, fill #0
  Filled 2022-12-18: qty 30, 30d supply, fill #1

## 2022-11-11 ENCOUNTER — Other Ambulatory Visit (HOSPITAL_COMMUNITY): Payer: Self-pay

## 2022-11-17 ENCOUNTER — Other Ambulatory Visit (HOSPITAL_COMMUNITY): Payer: Self-pay

## 2022-11-17 MED ORDER — ROSUVASTATIN CALCIUM 5 MG PO TABS
5.0000 mg | ORAL_TABLET | Freq: Every day | ORAL | 3 refills | Status: DC
  Filled 2022-11-17 – 2023-03-12 (×2): qty 90, 90d supply, fill #0
  Filled 2023-06-03: qty 90, 90d supply, fill #1
  Filled 2023-10-20: qty 90, 90d supply, fill #2

## 2022-11-18 DIAGNOSIS — R748 Abnormal levels of other serum enzymes: Secondary | ICD-10-CM | POA: Diagnosis not present

## 2022-11-18 DIAGNOSIS — I10 Essential (primary) hypertension: Secondary | ICD-10-CM | POA: Diagnosis not present

## 2022-11-18 DIAGNOSIS — E1165 Type 2 diabetes mellitus with hyperglycemia: Secondary | ICD-10-CM | POA: Diagnosis not present

## 2022-11-18 DIAGNOSIS — E7800 Pure hypercholesterolemia, unspecified: Secondary | ICD-10-CM | POA: Diagnosis not present

## 2022-11-18 DIAGNOSIS — E559 Vitamin D deficiency, unspecified: Secondary | ICD-10-CM | POA: Diagnosis not present

## 2022-11-18 DIAGNOSIS — E538 Deficiency of other specified B group vitamins: Secondary | ICD-10-CM | POA: Diagnosis not present

## 2022-11-19 ENCOUNTER — Other Ambulatory Visit (HOSPITAL_COMMUNITY): Payer: Self-pay

## 2022-11-19 DIAGNOSIS — E7800 Pure hypercholesterolemia, unspecified: Secondary | ICD-10-CM | POA: Diagnosis not present

## 2022-11-19 DIAGNOSIS — E538 Deficiency of other specified B group vitamins: Secondary | ICD-10-CM | POA: Diagnosis not present

## 2022-11-19 DIAGNOSIS — I10 Essential (primary) hypertension: Secondary | ICD-10-CM | POA: Diagnosis not present

## 2022-11-19 DIAGNOSIS — Z794 Long term (current) use of insulin: Secondary | ICD-10-CM | POA: Diagnosis not present

## 2022-11-19 DIAGNOSIS — M810 Age-related osteoporosis without current pathological fracture: Secondary | ICD-10-CM | POA: Diagnosis not present

## 2022-11-19 DIAGNOSIS — E1121 Type 2 diabetes mellitus with diabetic nephropathy: Secondary | ICD-10-CM | POA: Diagnosis not present

## 2022-11-19 DIAGNOSIS — I2510 Atherosclerotic heart disease of native coronary artery without angina pectoris: Secondary | ICD-10-CM | POA: Diagnosis not present

## 2022-11-19 MED ORDER — TRADJENTA 5 MG PO TABS
5.0000 mg | ORAL_TABLET | Freq: Every day | ORAL | 3 refills | Status: AC
  Filled 2022-11-19: qty 30, 30d supply, fill #0

## 2022-11-21 LAB — LAB REPORT - SCANNED
A1c: 7.5
EGFR: 90
TSH: 2.48 (ref 0.41–5.90)

## 2022-12-01 ENCOUNTER — Other Ambulatory Visit (HOSPITAL_COMMUNITY): Payer: Self-pay

## 2022-12-18 ENCOUNTER — Encounter (HOSPITAL_COMMUNITY): Payer: Self-pay

## 2022-12-18 ENCOUNTER — Other Ambulatory Visit (HOSPITAL_COMMUNITY): Payer: Self-pay

## 2022-12-21 ENCOUNTER — Other Ambulatory Visit (HOSPITAL_COMMUNITY): Payer: Self-pay

## 2022-12-24 ENCOUNTER — Other Ambulatory Visit (HOSPITAL_COMMUNITY): Payer: Self-pay

## 2023-01-21 ENCOUNTER — Other Ambulatory Visit (HOSPITAL_COMMUNITY): Payer: Self-pay

## 2023-01-26 DIAGNOSIS — E1121 Type 2 diabetes mellitus with diabetic nephropathy: Secondary | ICD-10-CM | POA: Diagnosis not present

## 2023-01-26 DIAGNOSIS — I2510 Atherosclerotic heart disease of native coronary artery without angina pectoris: Secondary | ICD-10-CM | POA: Diagnosis not present

## 2023-01-26 DIAGNOSIS — I10 Essential (primary) hypertension: Secondary | ICD-10-CM | POA: Diagnosis not present

## 2023-01-26 DIAGNOSIS — H6123 Impacted cerumen, bilateral: Secondary | ICD-10-CM | POA: Diagnosis not present

## 2023-01-26 DIAGNOSIS — E7800 Pure hypercholesterolemia, unspecified: Secondary | ICD-10-CM | POA: Diagnosis not present

## 2023-01-26 DIAGNOSIS — E559 Vitamin D deficiency, unspecified: Secondary | ICD-10-CM | POA: Diagnosis not present

## 2023-01-26 DIAGNOSIS — E538 Deficiency of other specified B group vitamins: Secondary | ICD-10-CM | POA: Diagnosis not present

## 2023-01-26 DIAGNOSIS — M810 Age-related osteoporosis without current pathological fracture: Secondary | ICD-10-CM | POA: Diagnosis not present

## 2023-01-26 DIAGNOSIS — Z0000 Encounter for general adult medical examination without abnormal findings: Secondary | ICD-10-CM | POA: Diagnosis not present

## 2023-02-01 ENCOUNTER — Other Ambulatory Visit (HOSPITAL_COMMUNITY): Payer: Self-pay

## 2023-02-18 ENCOUNTER — Other Ambulatory Visit (HOSPITAL_COMMUNITY): Payer: Self-pay

## 2023-02-18 ENCOUNTER — Other Ambulatory Visit: Payer: Self-pay

## 2023-02-18 ENCOUNTER — Encounter (HOSPITAL_COMMUNITY): Payer: Self-pay

## 2023-02-18 DIAGNOSIS — I10 Essential (primary) hypertension: Secondary | ICD-10-CM | POA: Diagnosis not present

## 2023-02-18 DIAGNOSIS — M810 Age-related osteoporosis without current pathological fracture: Secondary | ICD-10-CM | POA: Diagnosis not present

## 2023-02-18 DIAGNOSIS — Z794 Long term (current) use of insulin: Secondary | ICD-10-CM | POA: Diagnosis not present

## 2023-02-18 DIAGNOSIS — I2510 Atherosclerotic heart disease of native coronary artery without angina pectoris: Secondary | ICD-10-CM | POA: Diagnosis not present

## 2023-02-18 DIAGNOSIS — E7800 Pure hypercholesterolemia, unspecified: Secondary | ICD-10-CM | POA: Diagnosis not present

## 2023-02-18 DIAGNOSIS — E1121 Type 2 diabetes mellitus with diabetic nephropathy: Secondary | ICD-10-CM | POA: Diagnosis not present

## 2023-02-18 MED ORDER — FIASP FLEXTOUCH 100 UNIT/ML ~~LOC~~ SOPN
3.0000 [IU] | PEN_INJECTOR | Freq: Three times a day (TID) | SUBCUTANEOUS | 3 refills | Status: AC
  Filled 2023-02-18 – 2023-02-19 (×2): qty 15, 50d supply, fill #0

## 2023-02-19 ENCOUNTER — Other Ambulatory Visit (HOSPITAL_COMMUNITY): Payer: Self-pay

## 2023-02-24 ENCOUNTER — Other Ambulatory Visit (HOSPITAL_COMMUNITY): Payer: Self-pay

## 2023-02-25 ENCOUNTER — Other Ambulatory Visit: Payer: Self-pay

## 2023-02-26 ENCOUNTER — Other Ambulatory Visit: Payer: Self-pay

## 2023-03-01 ENCOUNTER — Other Ambulatory Visit: Payer: Self-pay

## 2023-03-03 ENCOUNTER — Other Ambulatory Visit: Payer: Self-pay

## 2023-03-03 DIAGNOSIS — M810 Age-related osteoporosis without current pathological fracture: Secondary | ICD-10-CM | POA: Diagnosis not present

## 2023-03-12 ENCOUNTER — Other Ambulatory Visit (HOSPITAL_COMMUNITY): Payer: Self-pay

## 2023-03-22 ENCOUNTER — Other Ambulatory Visit (HOSPITAL_COMMUNITY): Payer: Self-pay

## 2023-04-20 ENCOUNTER — Other Ambulatory Visit (HOSPITAL_COMMUNITY): Payer: Self-pay

## 2023-04-21 ENCOUNTER — Other Ambulatory Visit (HOSPITAL_COMMUNITY): Payer: Self-pay

## 2023-04-21 MED ORDER — METFORMIN HCL 500 MG PO TABS
1000.0000 mg | ORAL_TABLET | Freq: Two times a day (BID) | ORAL | 3 refills | Status: AC
  Filled 2023-04-21: qty 180, 45d supply, fill #0
  Filled 2023-06-03: qty 180, 45d supply, fill #1
  Filled 2023-09-14: qty 180, 45d supply, fill #2
  Filled 2023-10-20: qty 180, 45d supply, fill #3

## 2023-04-22 ENCOUNTER — Other Ambulatory Visit (HOSPITAL_COMMUNITY): Payer: Self-pay

## 2023-04-22 MED ORDER — METFORMIN HCL 500 MG PO TABS
1000.0000 mg | ORAL_TABLET | Freq: Two times a day (BID) | ORAL | 3 refills | Status: AC
  Filled 2023-04-22: qty 360, 90d supply, fill #0
  Filled 2023-12-06: qty 180, 45d supply, fill #0
  Filled 2024-01-17: qty 180, 45d supply, fill #1
  Filled 2024-02-21: qty 180, 45d supply, fill #2

## 2023-05-04 ENCOUNTER — Other Ambulatory Visit (HOSPITAL_COMMUNITY): Payer: Self-pay

## 2023-05-23 ENCOUNTER — Other Ambulatory Visit (HOSPITAL_COMMUNITY): Payer: Self-pay

## 2023-06-04 ENCOUNTER — Other Ambulatory Visit (HOSPITAL_COMMUNITY): Payer: Self-pay

## 2023-09-14 ENCOUNTER — Other Ambulatory Visit (HOSPITAL_COMMUNITY): Payer: Self-pay

## 2023-09-14 MED ORDER — LOSARTAN POTASSIUM 50 MG PO TABS
50.0000 mg | ORAL_TABLET | Freq: Every day | ORAL | 3 refills | Status: AC
  Filled 2023-09-14: qty 90, 90d supply, fill #0
  Filled 2023-12-20 – 2023-12-27 (×2): qty 90, 90d supply, fill #1
  Filled 2023-12-27: qty 60, 60d supply, fill #1
  Filled 2024-02-16: qty 60, 60d supply, fill #2
  Filled 2024-02-17: qty 90, 90d supply, fill #0

## 2023-09-16 ENCOUNTER — Other Ambulatory Visit (HOSPITAL_COMMUNITY): Payer: Self-pay

## 2023-09-23 ENCOUNTER — Encounter: Payer: Self-pay | Admitting: Internal Medicine

## 2023-10-14 ENCOUNTER — Other Ambulatory Visit (HOSPITAL_COMMUNITY): Payer: Self-pay

## 2023-10-14 ENCOUNTER — Other Ambulatory Visit: Payer: Self-pay

## 2023-10-20 ENCOUNTER — Other Ambulatory Visit (HOSPITAL_COMMUNITY): Payer: Self-pay

## 2023-10-21 ENCOUNTER — Other Ambulatory Visit (HOSPITAL_COMMUNITY): Payer: Self-pay

## 2023-11-10 ENCOUNTER — Encounter: Payer: Self-pay | Admitting: Physician Assistant

## 2023-11-10 ENCOUNTER — Ambulatory Visit: Attending: Cardiology | Admitting: Physician Assistant

## 2023-11-10 VITALS — BP 130/67 | HR 91 | Ht 61.0 in | Wt 120.0 lb

## 2023-11-10 DIAGNOSIS — I152 Hypertension secondary to endocrine disorders: Secondary | ICD-10-CM | POA: Diagnosis not present

## 2023-11-10 DIAGNOSIS — E119 Type 2 diabetes mellitus without complications: Secondary | ICD-10-CM | POA: Insufficient documentation

## 2023-11-10 DIAGNOSIS — I700 Atherosclerosis of aorta: Secondary | ICD-10-CM | POA: Insufficient documentation

## 2023-11-10 DIAGNOSIS — E1169 Type 2 diabetes mellitus with other specified complication: Secondary | ICD-10-CM | POA: Insufficient documentation

## 2023-11-10 DIAGNOSIS — E785 Hyperlipidemia, unspecified: Secondary | ICD-10-CM | POA: Insufficient documentation

## 2023-11-10 DIAGNOSIS — Z794 Long term (current) use of insulin: Secondary | ICD-10-CM | POA: Diagnosis not present

## 2023-11-10 DIAGNOSIS — E1159 Type 2 diabetes mellitus with other circulatory complications: Secondary | ICD-10-CM | POA: Diagnosis not present

## 2023-11-10 DIAGNOSIS — I2510 Atherosclerotic heart disease of native coronary artery without angina pectoris: Secondary | ICD-10-CM | POA: Diagnosis not present

## 2023-11-10 NOTE — Patient Instructions (Signed)
 Medication Instructions:  Your physician recommends that you continue on your current medications as directed. Please refer to the Current Medication list given to you today. *If you need a refill on your cardiac medications before your next appointment, please call your pharmacy*  Lab Work: None ordered If you have labs (blood work) drawn today and your tests are completely normal, you will receive your results only by: MyChart Message (if you have MyChart) OR A paper copy in the mail If you have any lab test that is abnormal or we need to change your treatment, we will call you to review the results.  Testing/Procedures: None ordered  Follow-Up: At [facility], you and your health needs are our priority.  As part of our continuing mission to provide you with exceptional heart care, our providers are all part of one team.  This team includes your primary Cardiologist (physician) and Advanced Practice Providers or APPs (Physician Assistants and Nurse Practitioners) who all work together to provide you with the care you need, when you need it.  Your next appointment:   12 month(s)  Provider:   Arun K Thukkani, MD    We recommend signing up for the patient portal called MyChart.  Sign up information is provided on this After Visit Summary.  MyChart is used to connect with patients for Virtual Visits (Telemedicine).  Patients are able to view lab/test results, encounter notes, upcoming appointments, etc.  Non-urgent messages can be sent to your provider as well.   To learn more about what you can do with MyChart, go to [email or web address].   Other Instructions

## 2023-11-10 NOTE — Progress Notes (Signed)
 Cardiology Office Note   Date:  11/10/2023  ID:  Bethany Ortiz, DOB 1945/09/24, MRN [id number] PCP: Royden Ronal Czar, FNP  [facility] Providers Cardiologist:  Lurena MARLA Red, MD   History of Present Illness Bethany Ortiz is a 78 y.o. female with a past medical history of elevated calcium  score, aortic atherosclerosis, type 2 diabetes mellitus on insulin , hyperlipidemia with low LP(a), hypertension, and BMI of 22 here for follow-up appointment.  History includes October 2023 patient was referred regarding elevated calcium  score.  She was completely asymptomatic at that time.  Able to do all her daily living activities without any issues.  Denies exertional angina, exertional dyspnea, presyncope, syncope, palpitations, PND, and orthopnea.  Denied any myalgias or arthralgias in response to a statin.  No emergency room visits or hospitalizations.  Does not smoke.  Plan was to obtain echocardiogram, discontinue amlodipine , and start losartan  25 mg daily.  Recheck an LP(a).  She was then seen in September 2024 and in the interim had an echocardiogram which was reassuring.  Her LP(a) was low.  Patient was seen in the emergency room for abnormal LFTs in February 2024.  She misunderstood instructions from her PCP regarding Crestor  dosing.  Her CK was elevated to the 3000's.  She was hydrated and sent home.  She went to [place] for a month with her daughter.  No issues while she was there.  Denied any cardiovascular complaints at the time of her last office visit which was September 2024.  She was tolerating medications well and was on Crestor  5 mg daily.  LDL back in May was above goal at 129.  Today, she presents with a hx of hyperlipidemia and hypertension who presents for a cardiovascular follow-up. She is accompanied by her daughter.  She is currently taking Crestor  (rosuvastatin ) 5 mg for hyperlipidemia, with her last cholesterol labs done last year. Her triglycerides were 63. Her  blood pressure readings at home and other doctor's offices range from 120s to 130s, with no high readings reported. She has been on losartan  for at least a year, last filled in August. She experiences no chest pain, shortness of breath, or peripheral edema.  She takes aspirin  daily and engages in regular physical activity, including walking. She is independent in daily activities, though her daughter notes she is slower with walking. She remains active in her daily life, including cooking.  Reports no shortness of breath nor dyspnea on exertion. Reports no chest pain, pressure, or tightness. No edema, orthopnea, PND. Reports no palpitations.   Discussed the use of AI scribe software for clinical note transcription with the patient, who gave verbal consent to proceed.   ROS: pertinent ROS in HPI  Studies Reviewed     Echo 10/31/21  IMPRESSIONS     1. Left ventricular ejection fraction, by estimation, is 65 to 70%. The  left ventricle has normal function. The left ventricle has no regional  wall motion abnormalities. There is mild asymmetric left ventricular  hypertrophy of the basal-septal segment.  Left ventricular diastolic parameters are consistent with Grade I  diastolic dysfunction (impaired relaxation).   2. Right ventricular systolic function is normal. The right ventricular  size is normal.   3. The mitral valve is normal in structure. Trivial mitral valve  regurgitation. No evidence of mitral stenosis.   4. The aortic valve is tricuspid. There is mild calcification of the  aortic valve. Aortic valve regurgitation is not visualized. No aortic  stenosis is present.   5.  The inferior vena cava is normal in size with greater than 50%  respiratory variability, suggesting right atrial pressure of 3 mmHg.    Physical Exam VS:  BP 130/67 (BP Location: Left Arm, Patient Position: Sitting, Cuff Size: Normal)   Pulse 91   Ht 5' 1 (1.549 m)   Wt 120 lb (54.4 kg)   SpO2 95%   BMI  22.67 kg/m        Wt Readings from Last 3 Encounters:  11/10/23 120 lb (54.4 kg)  09/28/22 112 lb (50.8 kg)  02/20/22 116 lb 13.5 oz (53 kg)    GEN: Well nourished, well developed in no acute distress NECK: No JVD; No carotid bruits CARDIAC: RRR, no murmurs, rubs, gallops RESPIRATORY:  Clear to auscultation without rales, wheezing or rhonchi  ABDOMEN: Soft, non-tender, non-distended EXTREMITIES:  No edema; No deformity   ASSESSMENT AND PLAN  Atherosclerotic heart disease of native coronary artery without angina pectoris Atherosclerotic heart disease is well-managed without angina symptoms. Blood pressure controlled at 130/67 mmHg. No chest pain, dyspnea, or edema. - Continue aspirin  daily. - Continue losartan  as prescribed. - Schedule follow-up in one year.  Type 2 diabetes mellitus without complications Type 2 diabetes managed with insulin . A1c slightly elevated in January, indicating potential need for insulin  adjustment. Triglycerides controlled at 63 mg/dL. - Schedule appointment with PharmD to review and possibly adjust insulin  regimen. - Plan for A1c re-evaluation during upcoming primary care appointment in January.  Hypertension -BP has been well controlled on current medication regimen -continue heart healthy, low sodium diet  HLD - She is due for lipid panel with her primary care doctor but her LDL was 42 last year with triglycerides 140. -Continue Crestor  5 mg daily   Dispo: She can follow-up in a year with Dr. Wendel or myself  Signed, Orren LOISE Fabry, PA-C

## 2023-11-15 ENCOUNTER — Other Ambulatory Visit (HOSPITAL_COMMUNITY): Payer: Self-pay

## 2023-11-16 ENCOUNTER — Other Ambulatory Visit (HOSPITAL_COMMUNITY): Payer: Self-pay

## 2023-11-16 MED ORDER — BASAGLAR KWIKPEN 100 UNIT/ML ~~LOC~~ SOPN
15.0000 [IU] | PEN_INJECTOR | Freq: Every day | SUBCUTANEOUS | 3 refills | Status: AC
  Filled 2023-11-16: qty 3, 10d supply, fill #0
  Filled 2023-12-11: qty 3, 10d supply, fill #1
  Filled 2024-01-04: qty 3, 10d supply, fill #2
  Filled 2024-01-27: qty 3, 10d supply, fill #3
  Filled 2024-02-21: qty 3, 10d supply, fill #4

## 2023-11-17 ENCOUNTER — Other Ambulatory Visit (HOSPITAL_COMMUNITY): Payer: Self-pay

## 2023-11-18 ENCOUNTER — Other Ambulatory Visit (HOSPITAL_COMMUNITY): Payer: Self-pay

## 2023-11-18 DIAGNOSIS — E1121 Type 2 diabetes mellitus with diabetic nephropathy: Secondary | ICD-10-CM | POA: Diagnosis not present

## 2023-11-18 DIAGNOSIS — Z794 Long term (current) use of insulin: Secondary | ICD-10-CM | POA: Diagnosis not present

## 2023-11-18 DIAGNOSIS — E7800 Pure hypercholesterolemia, unspecified: Secondary | ICD-10-CM | POA: Diagnosis not present

## 2023-11-18 DIAGNOSIS — I10 Essential (primary) hypertension: Secondary | ICD-10-CM | POA: Diagnosis not present

## 2023-11-18 DIAGNOSIS — M810 Age-related osteoporosis without current pathological fracture: Secondary | ICD-10-CM | POA: Diagnosis not present

## 2023-11-18 MED ORDER — INSULIN LISPRO (1 UNIT DIAL) 100 UNIT/ML (KWIKPEN)
3.0000 [IU] | PEN_INJECTOR | Freq: Three times a day (TID) | SUBCUTANEOUS | 3 refills | Status: AC
  Filled 2023-11-18: qty 3, 10d supply, fill #0
  Filled 2023-12-11: qty 3, 10d supply, fill #1
  Filled 2024-01-04: qty 3, 10d supply, fill #2
  Filled 2024-02-21: qty 3, 10d supply, fill #3

## 2023-12-06 ENCOUNTER — Other Ambulatory Visit (HOSPITAL_COMMUNITY): Payer: Self-pay

## 2023-12-13 ENCOUNTER — Other Ambulatory Visit: Payer: Self-pay

## 2023-12-13 ENCOUNTER — Other Ambulatory Visit (HOSPITAL_COMMUNITY): Payer: Self-pay

## 2023-12-21 ENCOUNTER — Other Ambulatory Visit: Payer: Self-pay

## 2023-12-21 ENCOUNTER — Other Ambulatory Visit (HOSPITAL_COMMUNITY): Payer: Self-pay

## 2023-12-21 ENCOUNTER — Encounter: Payer: Self-pay | Admitting: Pharmacist

## 2023-12-24 ENCOUNTER — Other Ambulatory Visit: Payer: Self-pay

## 2023-12-27 ENCOUNTER — Other Ambulatory Visit (HOSPITAL_COMMUNITY): Payer: Self-pay

## 2023-12-28 ENCOUNTER — Other Ambulatory Visit (HOSPITAL_COMMUNITY): Payer: Self-pay

## 2024-01-04 ENCOUNTER — Other Ambulatory Visit (HOSPITAL_COMMUNITY): Payer: Self-pay

## 2024-01-17 ENCOUNTER — Other Ambulatory Visit: Payer: Self-pay

## 2024-01-17 ENCOUNTER — Other Ambulatory Visit (HOSPITAL_COMMUNITY): Payer: Self-pay

## 2024-01-17 MED ORDER — ROSUVASTATIN CALCIUM 5 MG PO TABS
5.0000 mg | ORAL_TABLET | Freq: Every day | ORAL | 1 refills | Status: AC
  Filled 2024-01-17: qty 90, 90d supply, fill #0

## 2024-01-27 ENCOUNTER — Other Ambulatory Visit (HOSPITAL_COMMUNITY): Payer: Self-pay

## 2024-02-16 ENCOUNTER — Other Ambulatory Visit (HOSPITAL_COMMUNITY): Payer: Self-pay

## 2024-02-17 ENCOUNTER — Other Ambulatory Visit (HOSPITAL_BASED_OUTPATIENT_CLINIC_OR_DEPARTMENT_OTHER): Payer: Self-pay

## 2024-02-17 ENCOUNTER — Other Ambulatory Visit (HOSPITAL_COMMUNITY): Payer: Self-pay

## 2024-02-21 ENCOUNTER — Other Ambulatory Visit (HOSPITAL_COMMUNITY): Payer: Self-pay

## 2024-02-22 ENCOUNTER — Other Ambulatory Visit (HOSPITAL_COMMUNITY): Payer: Self-pay
